# Patient Record
Sex: Female | Born: 1950 | Race: Black or African American | Smoking: Never smoker
Health system: Southern US, Community
[De-identification: ages and names within clinical notes are randomized; demographics above are authoritative.]

## PROBLEM LIST (undated history)

## (undated) DIAGNOSIS — E7800 Pure hypercholesterolemia, unspecified: Secondary | ICD-10-CM

## (undated) DIAGNOSIS — I10 Essential (primary) hypertension: Secondary | ICD-10-CM

## (undated) DIAGNOSIS — D869 Sarcoidosis, unspecified: Secondary | ICD-10-CM

## (undated) DIAGNOSIS — R7303 Prediabetes: Secondary | ICD-10-CM

## (undated) DIAGNOSIS — M1990 Unspecified osteoarthritis, unspecified site: Secondary | ICD-10-CM

## (undated) HISTORY — PX: TUBAL LIGATION: SHX77

## (undated) HISTORY — PX: COLONOSCOPY: SHX174

## (undated) HISTORY — PX: BRONCHOSCOPY: SUR163

---

## 1989-08-27 HISTORY — PX: LUMBAR DISC SURGERY: SHX700

## 1991-08-28 HISTORY — PX: ACETABULUM FRACTURE SURGERY: SHX541

## 2021-04-19 ENCOUNTER — Other Ambulatory Visit: Payer: Self-pay | Admitting: Internal Medicine

## 2021-04-19 DIAGNOSIS — Z1231 Encounter for screening mammogram for malignant neoplasm of breast: Secondary | ICD-10-CM

## 2021-08-27 DIAGNOSIS — J189 Pneumonia, unspecified organism: Secondary | ICD-10-CM

## 2021-08-27 HISTORY — DX: Pneumonia, unspecified organism: J18.9

## 2021-10-23 ENCOUNTER — Other Ambulatory Visit: Payer: Self-pay | Admitting: Internal Medicine

## 2021-10-23 DIAGNOSIS — Z1231 Encounter for screening mammogram for malignant neoplasm of breast: Secondary | ICD-10-CM

## 2021-10-30 ENCOUNTER — Other Ambulatory Visit: Payer: Self-pay | Admitting: Orthopedic Surgery

## 2021-10-30 DIAGNOSIS — M1651 Unilateral post-traumatic osteoarthritis, right hip: Secondary | ICD-10-CM

## 2021-11-14 ENCOUNTER — Other Ambulatory Visit: Payer: Self-pay

## 2021-11-14 ENCOUNTER — Ambulatory Visit
Admission: RE | Admit: 2021-11-14 | Discharge: 2021-11-14 | Disposition: A | Discharge status: Home / Self Care | Payer: Medicare HMO | Source: Ambulatory Visit | Attending: Orthopedic Surgery | Admitting: Orthopedic Surgery

## 2021-11-14 DIAGNOSIS — M1651 Unilateral post-traumatic osteoarthritis, right hip: Secondary | ICD-10-CM | POA: Diagnosis present

## 2021-11-21 ENCOUNTER — Other Ambulatory Visit: Payer: Self-pay | Admitting: Orthopedic Surgery

## 2021-11-27 ENCOUNTER — Other Ambulatory Visit: Payer: Self-pay

## 2021-11-27 ENCOUNTER — Encounter
Admission: RE | Admit: 2021-11-27 | Discharge: 2021-11-27 | Disposition: A | Discharge status: Home / Self Care | Payer: Medicare HMO | Source: Ambulatory Visit | Attending: Orthopedic Surgery | Admitting: Orthopedic Surgery

## 2021-11-27 VITALS — BP 143/64 | HR 61 | Resp 14 | Ht 66.0 in | Wt 241.0 lb

## 2021-11-27 DIAGNOSIS — Z01818 Encounter for other preprocedural examination: Secondary | ICD-10-CM | POA: Diagnosis present

## 2021-11-27 HISTORY — DX: Unspecified osteoarthritis, unspecified site: M19.90

## 2021-11-27 HISTORY — DX: Prediabetes: R73.03

## 2021-11-27 HISTORY — DX: Essential (primary) hypertension: I10

## 2021-11-27 HISTORY — DX: Pure hypercholesterolemia, unspecified: E78.00

## 2021-11-27 HISTORY — DX: Sarcoidosis, unspecified: D86.9

## 2021-11-27 LAB — CBC WITH DIFFERENTIAL/PLATELET
Abs Immature Granulocytes: 0 10*3/uL (ref 0.00–0.07)
Basophils Absolute: 0 10*3/uL (ref 0.0–0.1)
Basophils Relative: 1 %
Eosinophils Absolute: 0.1 10*3/uL (ref 0.0–0.5)
Eosinophils Relative: 2 %
HCT: 40.3 % (ref 36.0–46.0)
Hemoglobin: 12.7 g/dL (ref 12.0–15.0)
Immature Granulocytes: 0 %
Lymphocytes Relative: 38 %
Lymphs Abs: 1.5 10*3/uL (ref 0.7–4.0)
MCH: 29.3 pg (ref 26.0–34.0)
MCHC: 31.5 g/dL (ref 30.0–36.0)
MCV: 92.9 fL (ref 80.0–100.0)
Monocytes Absolute: 0.3 10*3/uL (ref 0.1–1.0)
Monocytes Relative: 7 %
Neutro Abs: 2.1 10*3/uL (ref 1.7–7.7)
Neutrophils Relative %: 52 %
Platelets: 302 10*3/uL (ref 150–400)
RBC: 4.34 MIL/uL (ref 3.87–5.11)
RDW: 14.4 % (ref 11.5–15.5)
WBC: 4 10*3/uL (ref 4.0–10.5)
nRBC: 0 % (ref 0.0–0.2)

## 2021-11-27 LAB — TYPE AND SCREEN
ABO/RH(D): O POS
Antibody Screen: NEGATIVE

## 2021-11-27 LAB — URINALYSIS, ROUTINE W REFLEX MICROSCOPIC
Bilirubin Urine: NEGATIVE
Glucose, UA: NEGATIVE mg/dL
Hgb urine dipstick: NEGATIVE
Ketones, ur: NEGATIVE mg/dL
Nitrite: NEGATIVE
Protein, ur: NEGATIVE mg/dL
Specific Gravity, Urine: 1.008 (ref 1.005–1.030)
pH: 6 (ref 5.0–8.0)

## 2021-11-27 LAB — SURGICAL PCR SCREEN
MRSA, PCR: NEGATIVE
Staphylococcus aureus: NEGATIVE

## 2021-11-27 NOTE — Patient Instructions (Signed)
Your procedure is scheduled on: 12/07/21 Report to [facility]. To find out your arrival time please call [phone] between 1PM - 3PM on 12/06/21.  Remember: Instructions that are not followed completely may result in serious medical risk, up to and including death, or upon the discretion of your surgeon and anesthesiologist your surgery may need to be rescheduled.     _X__ 1. Do not eat food after midnight the night before your procedure.                 No gum chewing or hard candies. You may drink clear liquids up to 2 hours                 before you are scheduled to arrive for your surgery- DO not drink clear                 liquids within 2 hours of the start of your surgery.                 Clear Liquids include:  water, apple juice without pulp, clear carbohydrate                 drink such as Clearfast or Gatorade, Black Coffee or Tea (Do not add                 anything to coffee or tea). Diabetics water only  DRINK THE ENSURE "CLEAR" PRE SURGERY DRINK 2 HOURS BEFORE ARRIVING  __X__2.  On the morning of surgery brush your teeth with toothpaste and water, you                 may rinse your mouth with mouthwash if you wish.  Do not swallow any              toothpaste of mouthwash.     _X__ 3.  No Alcohol for 24 hours before or after surgery.   _X__ 4.  Do Not Smoke or use e-cigarettes For 24 Hours Prior to Your Surgery.                 Do not use any chewable tobacco products for at least 6 hours prior to                 surgery.  ____  5.  Bring all medications with you on the day of surgery if instructed.   __X__  6.  Notify your doctor if there is any change in your medical condition      (cold, fever, infections).     Do not wear jewelry, make-up, hairpins, clips or nail polish. Do not wear lotions, powders, or perfumes. May wear deodorant if needed Do not shave body hair 48 hours prior to surgery. Men may shave face  and neck. Do not bring valuables to the hospital.    [facility] is not responsible for any belongings or valuables.  Contacts, dentures/partials or body piercings may not be worn into surgery. Bring a case for your contacts, glasses or hearing aids, a denture cup will be supplied. Leave your suitcase in the car. After surgery it may be brought to your room. For patients admitted to the hospital, discharge time is determined by your treatment team.   Patients discharged the day of surgery will not be allowed to drive home.   Please read over the following fact sheets that you were given:  MRSA Information, CHG soap, Incentive Spirometer, Ensure  __X__ Take these medicines the morning of surgery with A SIP OF WATER:    1. atorvastatin (LIPITOR) 40 MG tablet  2.   3.   4.  5.  6.  ____ Fleet Enema (as directed)   __X__ Use CHG Soap/SAGE wipes as directed  __X__ Use inhalers on the day of surgery  ____ Stop metformin/Janumet/Farxiga 2 days prior to surgery    ____ Take 1/2 of usual insulin dose the night before surgery. No insulin the morning          of surgery.   ____ Stop Blood Thinners Coumadin/Plavix/Xarelto/Pleta/Pradaxa/Eliquis/Effient/Aspirin  on   Or contact your Surgeon, Cardiologist or Medical Doctor regarding  ability to stop your blood thinners  __X__ Stop Anti-inflammatories 7 days before surgery such as Advil, Ibuprofen, Motrin,  BC or Goodies Powder, Naprosyn, Naproxen, Aleve, Aspirin   STOP MELOXIM 7 DAYS PRIOR TO SURGERY. YOU MAY TAKE TYLENOL 650 MG EVERY 6 HOURS AND STILL TAKE YOUR TYLENOL PM AT BEDTIME  __X__ Stop all herbals and supplements, fish oil or vitamins FOR 7 DAYS  until after surgery.    ____ Bring C-Pap to the hospital.

## 2021-12-01 ENCOUNTER — Ambulatory Visit
Admission: RE | Admit: 2021-12-01 | Discharge: 2021-12-01 | Disposition: A | Discharge status: Home / Self Care | Payer: Medicare HMO | Source: Ambulatory Visit | Attending: Internal Medicine | Admitting: Internal Medicine

## 2021-12-01 DIAGNOSIS — Z1231 Encounter for screening mammogram for malignant neoplasm of breast: Secondary | ICD-10-CM | POA: Diagnosis present

## 2021-12-04 ENCOUNTER — Inpatient Hospital Stay
Admission: RE | Admit: 2021-12-04 | Discharge: 2021-12-04 | Disposition: A | Discharge status: Home / Self Care | Payer: Self-pay | Source: Ambulatory Visit | Attending: *Deleted | Admitting: *Deleted

## 2021-12-04 ENCOUNTER — Other Ambulatory Visit: Payer: Self-pay | Admitting: *Deleted

## 2021-12-04 DIAGNOSIS — Z1231 Encounter for screening mammogram for malignant neoplasm of breast: Secondary | ICD-10-CM

## 2021-12-06 MED ORDER — CHLORHEXIDINE GLUCONATE 0.12 % MT SOLN: 15.0000 mL | Freq: Once | OROMUCOSAL | Status: AC

## 2021-12-06 MED ORDER — FAMOTIDINE 20 MG PO TABS: 20.0000 mg | ORAL_TABLET | Freq: Once | ORAL | Status: AC

## 2021-12-06 MED ORDER — LACTATED RINGERS IV SOLN: INTRAVENOUS | Status: DC

## 2021-12-06 MED ORDER — CEFAZOLIN SODIUM-DEXTROSE 2-4 GM/100ML-% IV SOLN
2.0000 g | INTRAVENOUS | Status: AC
  Administered 2021-12-07: 2 g via INTRAVENOUS

## 2021-12-06 MED ORDER — ORAL CARE MOUTH RINSE: 15.0000 mL | Freq: Once | OROMUCOSAL | Status: AC

## 2021-12-07 ENCOUNTER — Ambulatory Visit: Payer: Medicare HMO | Admitting: Urgent Care

## 2021-12-07 ENCOUNTER — Other Ambulatory Visit: Payer: Self-pay

## 2021-12-07 ENCOUNTER — Observation Stay
Admission: RE | Admit: 2021-12-07 | Discharge: 2021-12-08 | Disposition: A | Discharge status: Home Health | Payer: Medicare HMO | Attending: Orthopedic Surgery | Admitting: Orthopedic Surgery

## 2021-12-07 ENCOUNTER — Ambulatory Visit: Payer: Medicare HMO

## 2021-12-07 ENCOUNTER — Encounter
Admission: RE | Disposition: A | Discharge status: Home Health | Payer: Self-pay | Source: Home / Self Care | Attending: Orthopedic Surgery

## 2021-12-07 ENCOUNTER — Encounter: Payer: Self-pay | Admitting: Orthopedic Surgery

## 2021-12-07 ENCOUNTER — Observation Stay: Payer: Medicare HMO

## 2021-12-07 DIAGNOSIS — Z79899 Other long term (current) drug therapy: Secondary | ICD-10-CM | POA: Insufficient documentation

## 2021-12-07 DIAGNOSIS — I10 Essential (primary) hypertension: Secondary | ICD-10-CM | POA: Diagnosis not present

## 2021-12-07 DIAGNOSIS — R7303 Prediabetes: Secondary | ICD-10-CM | POA: Insufficient documentation

## 2021-12-07 DIAGNOSIS — M1651 Unilateral post-traumatic osteoarthritis, right hip: Principal | ICD-10-CM | POA: Insufficient documentation

## 2021-12-07 DIAGNOSIS — Z96641 Presence of right artificial hip joint: Secondary | ICD-10-CM

## 2021-12-07 HISTORY — PX: TOTAL HIP ARTHROPLASTY: SHX124

## 2021-12-07 LAB — CBC
HCT: 31.4 % — ABNORMAL LOW (ref 36.0–46.0)
Hemoglobin: 9.7 g/dL — ABNORMAL LOW (ref 12.0–15.0)
MCH: 29.4 pg (ref 26.0–34.0)
MCHC: 30.9 g/dL (ref 30.0–36.0)
MCV: 95.2 fL (ref 80.0–100.0)
Platelets: 213 10*3/uL (ref 150–400)
RBC: 3.3 MIL/uL — ABNORMAL LOW (ref 3.87–5.11)
RDW: 14.3 % (ref 11.5–15.5)
WBC: 8.4 10*3/uL (ref 4.0–10.5)
nRBC: 0 % (ref 0.0–0.2)

## 2021-12-07 LAB — ABO/RH: ABO/RH(D): O POS

## 2021-12-07 LAB — CREATININE, SERUM
Creatinine, Ser: 1.04 mg/dL — ABNORMAL HIGH (ref 0.44–1.00)
GFR, Estimated: 58 mL/min — ABNORMAL LOW (ref >60)

## 2021-12-07 SURGERY — ARTHROPLASTY, HIP, TOTAL, ANTERIOR APPROACH
Anesthesia: General | Site: Hip | Laterality: Right

## 2021-12-07 MED ORDER — HEPARIN 30,000 UNITS/1000 ML (OHS) CELLSAVER SOLUTION
Status: AC | PRN
  Administered 2021-12-07: 1

## 2021-12-07 MED ORDER — FENTANYL CITRATE (PF) 100 MCG/2ML IJ SOLN: 25.0000 ug | INTRAMUSCULAR | Status: DC | PRN

## 2021-12-07 MED ORDER — PANTOPRAZOLE SODIUM 40 MG PO TBEC
DELAYED_RELEASE_TABLET | ORAL | Status: AC
  Administered 2021-12-07: 40 mg via ORAL
  Filled 2021-12-07: qty 1

## 2021-12-07 MED ORDER — HYDROCODONE-ACETAMINOPHEN 7.5-325 MG PO TABS
ORAL_TABLET | ORAL | Status: AC
  Filled 2021-12-07: qty 2

## 2021-12-07 MED ORDER — SURGIPHOR WOUND IRRIGATION SYSTEM - OPTIME: TOPICAL | Status: DC | PRN

## 2021-12-07 MED ORDER — KETOROLAC TROMETHAMINE 30 MG/ML IJ SOLN
INTRAMUSCULAR | Status: DC | PRN
  Administered 2021-12-07: 15 mg via INTRAVENOUS

## 2021-12-07 MED ORDER — METHOCARBAMOL 1000 MG/10ML IJ SOLN
500.0000 mg | Freq: Four times a day (QID) | INTRAVENOUS | Status: DC | PRN
  Filled 2021-12-07: qty 5

## 2021-12-07 MED ORDER — MONTELUKAST SODIUM 10 MG PO TABS
10.0000 mg | ORAL_TABLET | Freq: Every evening | ORAL | Status: DC | PRN
  Filled 2021-12-07: qty 1

## 2021-12-07 MED ORDER — PHENOL 1.4 % MT LIQD: 1.0000 | OROMUCOSAL | Status: DC | PRN

## 2021-12-07 MED ORDER — ONDANSETRON HCL 4 MG/2ML IJ SOLN: 4.0000 mg | Freq: Once | INTRAMUSCULAR | Status: DC | PRN

## 2021-12-07 MED ORDER — FAMOTIDINE 20 MG PO TABS
ORAL_TABLET | ORAL | Status: AC
  Administered 2021-12-07: 20 mg via ORAL
  Filled 2021-12-07: qty 1

## 2021-12-07 MED ORDER — PROPOFOL 500 MG/50ML IV EMUL
INTRAVENOUS | Status: DC | PRN
  Administered 2021-12-07: 100 ug/kg/min via INTRAVENOUS

## 2021-12-07 MED ORDER — CEFAZOLIN SODIUM-DEXTROSE 2-4 GM/100ML-% IV SOLN
INTRAVENOUS | Status: AC
Start: 2021-12-07 — End: 2021-12-07
  Filled 2021-12-07: qty 100

## 2021-12-07 MED ORDER — LOSARTAN POTASSIUM-HCTZ 50-12.5 MG PO TABS: 1.0000 | ORAL_TABLET | Freq: Every day | ORAL | Status: DC

## 2021-12-07 MED ORDER — SODIUM CHLORIDE 0.9 % IV SOLN: INTRAVENOUS | Status: DC

## 2021-12-07 MED ORDER — MORPHINE SULFATE (PF) 4 MG/ML IV SOLN
INTRAVENOUS | Status: AC
  Filled 2021-12-07: qty 1

## 2021-12-07 MED ORDER — CEFAZOLIN SODIUM-DEXTROSE 2-4 GM/100ML-% IV SOLN
INTRAVENOUS | Status: AC
  Administered 2021-12-07: 2 g via INTRAVENOUS
  Filled 2021-12-07: qty 100

## 2021-12-07 MED ORDER — FLUTICASONE FUROATE-VILANTEROL 100-25 MCG/ACT IN AEPB
1.0000 | INHALATION_SPRAY | Freq: Every day | RESPIRATORY_TRACT | Status: DC
  Administered 2021-12-08: 1 via RESPIRATORY_TRACT
  Filled 2021-12-07: qty 28

## 2021-12-07 MED ORDER — BUPIVACAINE-EPINEPHRINE (PF) 0.25% -1:200000 IJ SOLN
INTRAMUSCULAR | Status: AC
  Filled 2021-12-07: qty 30

## 2021-12-07 MED ORDER — TRAMADOL HCL 50 MG PO TABS
50.0000 mg | ORAL_TABLET | Freq: Four times a day (QID) | ORAL | Status: DC
  Administered 2021-12-07 – 2021-12-08 (×2): 50 mg via ORAL

## 2021-12-07 MED ORDER — ZOLPIDEM TARTRATE 5 MG PO TABS: 5.0000 mg | ORAL_TABLET | Freq: Every evening | ORAL | Status: DC | PRN

## 2021-12-07 MED ORDER — HYDROCODONE-ACETAMINOPHEN 5-325 MG PO TABS
1.0000 | ORAL_TABLET | ORAL | Status: DC | PRN
  Administered 2021-12-07: 2 via ORAL

## 2021-12-07 MED ORDER — DOCUSATE SODIUM 100 MG PO CAPS
ORAL_CAPSULE | ORAL | Status: AC
  Administered 2021-12-07: 100 mg via ORAL
  Filled 2021-12-07: qty 1

## 2021-12-07 MED ORDER — BUPIVACAINE HCL (PF) 0.5 % IJ SOLN
INTRAMUSCULAR | Status: DC | PRN
  Administered 2021-12-07: 2.3 mL

## 2021-12-07 MED ORDER — PHENYLEPHRINE HCL-NACL 20-0.9 MG/250ML-% IV SOLN
INTRAVENOUS | Status: DC | PRN
  Administered 2021-12-07: 40 ug/min via INTRAVENOUS

## 2021-12-07 MED ORDER — 0.9 % SODIUM CHLORIDE (POUR BTL) OPTIME
TOPICAL | Status: DC | PRN
  Administered 2021-12-07: 1000 mL

## 2021-12-07 MED ORDER — LOSARTAN POTASSIUM 50 MG PO TABS
50.0000 mg | ORAL_TABLET | Freq: Every day | ORAL | Status: DC
  Filled 2021-12-07 (×2): qty 1

## 2021-12-07 MED ORDER — ACETAMINOPHEN 325 MG PO TABS: 325.0000 mg | ORAL_TABLET | Freq: Four times a day (QID) | ORAL | Status: DC | PRN

## 2021-12-07 MED ORDER — METOCLOPRAMIDE HCL 10 MG PO TABS: 5.0000 mg | ORAL_TABLET | Freq: Three times a day (TID) | ORAL | Status: DC | PRN

## 2021-12-07 MED ORDER — ONDANSETRON HCL 4 MG/2ML IJ SOLN
INTRAMUSCULAR | Status: AC
  Filled 2021-12-07: qty 2

## 2021-12-07 MED ORDER — ACETAMINOPHEN 10 MG/ML IV SOLN
INTRAVENOUS | Status: AC
  Filled 2021-12-07: qty 100

## 2021-12-07 MED ORDER — ALUM & MAG HYDROXIDE-SIMETH 200-200-20 MG/5ML PO SUSP: 30.0000 mL | ORAL | Status: DC | PRN

## 2021-12-07 MED ORDER — KETOROLAC TROMETHAMINE 30 MG/ML IJ SOLN
INTRAMUSCULAR | Status: AC
  Filled 2021-12-07: qty 1

## 2021-12-07 MED ORDER — CEFAZOLIN SODIUM-DEXTROSE 2-4 GM/100ML-% IV SOLN: 2.0000 g | Freq: Four times a day (QID) | INTRAVENOUS | Status: AC

## 2021-12-07 MED ORDER — ONDANSETRON HCL 4 MG PO TABS: 4.0000 mg | ORAL_TABLET | Freq: Four times a day (QID) | ORAL | Status: DC | PRN

## 2021-12-07 MED ORDER — PANTOPRAZOLE SODIUM 40 MG PO TBEC: 40.0000 mg | DELAYED_RELEASE_TABLET | Freq: Every day | ORAL | Status: DC

## 2021-12-07 MED ORDER — HEPARIN 30,000 UNITS/1000 ML (OHS) CELLSAVER SOLUTION
Status: AC
  Filled 2021-12-07: qty 1000

## 2021-12-07 MED ORDER — MIDAZOLAM HCL 2 MG/2ML IJ SOLN
INTRAMUSCULAR | Status: AC
  Filled 2021-12-07: qty 2

## 2021-12-07 MED ORDER — ONDANSETRON HCL 4 MG/2ML IJ SOLN
INTRAMUSCULAR | Status: DC | PRN
  Administered 2021-12-07: 4 mg via INTRAVENOUS

## 2021-12-07 MED ORDER — MORPHINE SULFATE (PF) 4 MG/ML IV SOLN: 0.5200 mg | INTRAVENOUS | Status: DC | PRN

## 2021-12-07 MED ORDER — TRAMADOL HCL 50 MG PO TABS
ORAL_TABLET | ORAL | Status: AC
  Filled 2021-12-07: qty 1

## 2021-12-07 MED ORDER — EPHEDRINE 5 MG/ML INJ
INTRAVENOUS | Status: AC
  Filled 2021-12-07: qty 5

## 2021-12-07 MED ORDER — DIPHENHYDRAMINE HCL 12.5 MG/5ML PO ELIX: 12.5000 mg | ORAL_SOLUTION | ORAL | Status: DC | PRN

## 2021-12-07 MED ORDER — METHOCARBAMOL 500 MG PO TABS
ORAL_TABLET | ORAL | Status: AC
  Administered 2021-12-07: 500 mg via ORAL
  Filled 2021-12-07: qty 1

## 2021-12-07 MED ORDER — SENNOSIDES-DOCUSATE SODIUM 8.6-50 MG PO TABS: 1.0000 | ORAL_TABLET | Freq: Every evening | ORAL | Status: DC | PRN

## 2021-12-07 MED ORDER — BUPIVACAINE LIPOSOME 1.3 % IJ SUSP
INTRAMUSCULAR | Status: AC
  Filled 2021-12-07: qty 20

## 2021-12-07 MED ORDER — ENOXAPARIN SODIUM 40 MG/0.4ML IJ SOSY: 40.0000 mg | PREFILLED_SYRINGE | INTRAMUSCULAR | Status: DC

## 2021-12-07 MED ORDER — BUPIVACAINE HCL (PF) 0.5 % IJ SOLN
INTRAMUSCULAR | Status: AC
  Filled 2021-12-07: qty 10

## 2021-12-07 MED ORDER — LACTATED RINGERS IV SOLN: INTRAVENOUS | Status: DC

## 2021-12-07 MED ORDER — TRAMADOL HCL 50 MG PO TABS
ORAL_TABLET | ORAL | Status: AC
  Administered 2021-12-07: 50 mg via ORAL
  Filled 2021-12-07: qty 1

## 2021-12-07 MED ORDER — FENTANYL CITRATE (PF) 100 MCG/2ML IJ SOLN
INTRAMUSCULAR | Status: DC | PRN
  Administered 2021-12-07: 50 ug via INTRAVENOUS

## 2021-12-07 MED ORDER — PHENYLEPHRINE HCL (PRESSORS) 10 MG/ML IV SOLN
INTRAVENOUS | Status: DC | PRN
  Administered 2021-12-07 (×3): 160 ug via INTRAVENOUS

## 2021-12-07 MED ORDER — METHOCARBAMOL 500 MG PO TABS: 500.0000 mg | ORAL_TABLET | Freq: Four times a day (QID) | ORAL | Status: DC | PRN

## 2021-12-07 MED ORDER — FLEET ENEMA 7-19 GM/118ML RE ENEM: 1.0000 | ENEMA | Freq: Once | RECTAL | Status: DC | PRN

## 2021-12-07 MED ORDER — MIDAZOLAM HCL 5 MG/5ML IJ SOLN
INTRAMUSCULAR | Status: DC | PRN
  Administered 2021-12-07 (×2): 1 mg via INTRAVENOUS

## 2021-12-07 MED ORDER — OXYCODONE HCL 5 MG/5ML PO SOLN: 5.0000 mg | Freq: Once | ORAL | Status: DC | PRN

## 2021-12-07 MED ORDER — HEMOSTATIC AGENTS (NO CHARGE) OPTIME
TOPICAL | Status: DC | PRN
  Administered 2021-12-07: 2 via TOPICAL

## 2021-12-07 MED ORDER — BISACODYL 10 MG RE SUPP
10.0000 mg | Freq: Every day | RECTAL | Status: DC | PRN
  Filled 2021-12-07: qty 1

## 2021-12-07 MED ORDER — HYDROCODONE-ACETAMINOPHEN 7.5-325 MG PO TABS
1.0000 | ORAL_TABLET | ORAL | Status: DC | PRN
  Administered 2021-12-07: 2 via ORAL
  Administered 2021-12-08: 1 via ORAL

## 2021-12-07 MED ORDER — PHENYLEPHRINE HCL-NACL 20-0.9 MG/250ML-% IV SOLN
INTRAVENOUS | Status: AC
  Filled 2021-12-07: qty 250

## 2021-12-07 MED ORDER — HYDROCODONE-ACETAMINOPHEN 5-325 MG PO TABS
ORAL_TABLET | ORAL | Status: AC
  Filled 2021-12-07: qty 2

## 2021-12-07 MED ORDER — ALBUTEROL SULFATE (2.5 MG/3ML) 0.083% IN NEBU: 3.0000 mL | INHALATION_SOLUTION | Freq: Four times a day (QID) | RESPIRATORY_TRACT | Status: DC | PRN

## 2021-12-07 MED ORDER — ACETAMINOPHEN 10 MG/ML IV SOLN: 1000.0000 mg | Freq: Once | INTRAVENOUS | Status: DC | PRN

## 2021-12-07 MED ORDER — FENTANYL CITRATE (PF) 100 MCG/2ML IJ SOLN
INTRAMUSCULAR | Status: AC
  Filled 2021-12-07: qty 2

## 2021-12-07 MED ORDER — MENTHOL 3 MG MT LOZG: 1.0000 | LOZENGE | OROMUCOSAL | Status: DC | PRN

## 2021-12-07 MED ORDER — GLYCOPYRROLATE 0.2 MG/ML IJ SOLN
INTRAMUSCULAR | Status: DC | PRN
  Administered 2021-12-07 (×2): .1 mg via INTRAVENOUS

## 2021-12-07 MED ORDER — MORPHINE SULFATE (PF) 4 MG/ML IV SOLN: 0.5000 mg | INTRAVENOUS | Status: DC | PRN

## 2021-12-07 MED ORDER — EPHEDRINE SULFATE (PRESSORS) 50 MG/ML IJ SOLN
INTRAMUSCULAR | Status: DC | PRN
  Administered 2021-12-07: 10 mg via INTRAVENOUS
  Administered 2021-12-07: 5 mg via INTRAVENOUS
  Administered 2021-12-07: 10 mg via INTRAVENOUS

## 2021-12-07 MED ORDER — CHLORHEXIDINE GLUCONATE 0.12 % MT SOLN
OROMUCOSAL | Status: AC
  Administered 2021-12-07: 15 mL via OROMUCOSAL
  Filled 2021-12-07: qty 15

## 2021-12-07 MED ORDER — VITAMIN D3 25 MCG (1000 UNIT) PO TABS
1000.0000 [IU] | ORAL_TABLET | Freq: Every day | ORAL | Status: DC
  Administered 2021-12-07 – 2021-12-08 (×2): 1000 [IU] via ORAL
  Filled 2021-12-07 (×2): qty 1

## 2021-12-07 MED ORDER — MORPHINE SULFATE (PF) 4 MG/ML IV SOLN
0.5200 mg | INTRAVENOUS | Status: DC | PRN
  Administered 2021-12-07: 1 mg via INTRAVENOUS

## 2021-12-07 MED ORDER — METOCLOPRAMIDE HCL 5 MG/ML IJ SOLN: 5.0000 mg | Freq: Three times a day (TID) | INTRAMUSCULAR | Status: DC | PRN

## 2021-12-07 MED ORDER — DOCUSATE SODIUM 100 MG PO CAPS
100.0000 mg | ORAL_CAPSULE | Freq: Two times a day (BID) | ORAL | Status: DC
  Administered 2021-12-08: 100 mg via ORAL

## 2021-12-07 MED ORDER — TRANEXAMIC ACID 1000 MG/10ML IV SOLN
INTRAVENOUS | Status: AC
  Filled 2021-12-07: qty 10

## 2021-12-07 MED ORDER — HYDROCHLOROTHIAZIDE 12.5 MG PO TABS
12.5000 mg | ORAL_TABLET | Freq: Every day | ORAL | Status: DC
  Filled 2021-12-07 (×2): qty 1

## 2021-12-07 MED ORDER — OXYCODONE HCL 5 MG PO TABS: 5.0000 mg | ORAL_TABLET | Freq: Once | ORAL | Status: DC | PRN

## 2021-12-07 MED ORDER — ONDANSETRON HCL 4 MG/2ML IJ SOLN
4.0000 mg | Freq: Four times a day (QID) | INTRAMUSCULAR | Status: DC | PRN
  Administered 2021-12-08: 4 mg via INTRAVENOUS

## 2021-12-07 MED ORDER — TRANEXAMIC ACID-NACL 1000-0.7 MG/100ML-% IV SOLN
INTRAVENOUS | Status: DC | PRN
  Administered 2021-12-07: 1000 mg via INTRAVENOUS

## 2021-12-07 MED ORDER — SODIUM CHLORIDE 0.9 % IV SOLN: INTRAVENOUS | Status: DC | PRN

## 2021-12-07 MED ORDER — SODIUM CHLORIDE 0.9 % IR SOLN
Status: DC | PRN
Start: 2021-12-07 — End: 2021-12-07
  Administered 2021-12-07: 250 mL

## 2021-12-07 MED ORDER — ATORVASTATIN CALCIUM 20 MG PO TABS
40.0000 mg | ORAL_TABLET | Freq: Every day | ORAL | Status: DC
  Administered 2021-12-08: 40 mg via ORAL
  Filled 2021-12-07 (×2): qty 2

## 2021-12-07 MED ORDER — PROPOFOL 1000 MG/100ML IV EMUL
INTRAVENOUS | Status: AC
  Filled 2021-12-07: qty 100

## 2021-12-07 MED ORDER — ACETAMINOPHEN 10 MG/ML IV SOLN
INTRAVENOUS | Status: DC | PRN
Start: 2021-12-07 — End: 2021-12-07
  Administered 2021-12-07: 1000 mg via INTRAVENOUS

## 2021-12-07 MED ORDER — BUPIVACAINE-EPINEPHRINE 0.25% -1:200000 IJ SOLN
INTRAMUSCULAR | Status: DC | PRN
  Administered 2021-12-07: 90 mL via INTRAMUSCULAR

## 2021-12-07 MED ORDER — SODIUM CHLORIDE FLUSH 0.9 % IV SOLN
INTRAVENOUS | Status: AC
  Filled 2021-12-07: qty 40

## 2021-12-07 SURGICAL SUPPLY — 58 items
BLADE SAGITTAL AGGR TOOTH XLG (BLADE) ×2 IMPLANT
BNDG COHESIVE 6X5 TAN ST LF (GAUZE/BANDAGES/DRESSINGS) ×5 IMPLANT
CANISTER WOUND CARE 500ML ATS (WOUND CARE) ×2 IMPLANT
CHLORAPREP W/TINT 26 (MISCELLANEOUS) ×2 IMPLANT
COVER BACK TABLE REUSABLE LG (DRAPES) ×2 IMPLANT
DRAPE 3/4 80X56 (DRAPES) ×6 IMPLANT
DRAPE C-ARM XRAY 36X54 (DRAPES) ×2 IMPLANT
DRAPE INCISE IOBAN 66X60 STRL (DRAPES) ×1 IMPLANT
DRAPE POUCH INSTRU U-SHP 10X18 (DRAPES) ×2 IMPLANT
DRESSING SURGICEL FIBRLLR 1X2 (HEMOSTASIS) ×2 IMPLANT
DRSG MEPILEX SACRM 8.7X9.8 (GAUZE/BANDAGES/DRESSINGS) ×2 IMPLANT
DRSG SURGICEL FIBRILLAR 1X2 (HEMOSTASIS) ×4
ELECT BLADE 6.5 EXT (BLADE) ×2 IMPLANT
ELECT REM PT RETURN 9FT ADLT (ELECTROSURGICAL) ×2
ELECTRODE REM PT RTRN 9FT ADLT (ELECTROSURGICAL) ×1 IMPLANT
GLOVE SURG SYN 9.0  PF PI (GLOVE) ×2
GLOVE SURG SYN 9.0 PF PI (GLOVE) ×2 IMPLANT
GLOVE SURG UNDER POLY LF SZ9 (GLOVE) ×2 IMPLANT
GOWN SRG 2XL LVL 4 RGLN SLV (GOWNS) ×1 IMPLANT
GOWN STRL NON-REIN 2XL LVL4 (GOWNS) ×1
GOWN STRL REUS W/ TWL LRG LVL3 (GOWN DISPOSABLE) ×1 IMPLANT
GOWN STRL REUS W/TWL LRG LVL3 (GOWN DISPOSABLE) ×1
HIP FEM HD M 28 (Head) ×1 IMPLANT
HOLDER FOLEY CATH W/STRAP (MISCELLANEOUS) ×1 IMPLANT
HOOD PEEL AWAY FLYTE STAYCOOL (MISCELLANEOUS) ×2 IMPLANT
KIT PREVENA INCISION MGT 13 (CANNISTER) ×2 IMPLANT
LINE SUCTION ATS CATS PLUS (LINER) ×1 IMPLANT
LINER DBL MOB SZ 0 52MM (Liner) ×1 IMPLANT
MANIFOLD NEPTUNE II (INSTRUMENTS) ×2 IMPLANT
MASTERLOC HIP LATERAL S7 (Hips) ×1 IMPLANT
MAT ABSORB  FLUID 56X50 GRAY (MISCELLANEOUS) ×1
MAT ABSORB FLUID 56X50 GRAY (MISCELLANEOUS) ×1 IMPLANT
NDL SPNL 20GX3.5 QUINCKE YW (NEEDLE) ×2 IMPLANT
NEEDLE SPNL 20GX3.5 QUINCKE YW (NEEDLE) ×4 IMPLANT
NS IRRIG 1000ML POUR BTL (IV SOLUTION) ×2 IMPLANT
PACK HIP COMPR (MISCELLANEOUS) ×2 IMPLANT
SCALPEL PROTECTED #10 DISP (BLADE) ×4 IMPLANT
SHELL ACETABULAR SZ 52 DM (Shell) ×1 IMPLANT
SOLUTION IRRIG SURGIPHOR (IV SOLUTION) ×1 IMPLANT
STAPLER SKIN PROX 35W (STAPLE) ×2 IMPLANT
STRAP SAFETY 5IN WIDE (MISCELLANEOUS) ×2 IMPLANT
SUT DVC 2 QUILL PDO  T11 36X36 (SUTURE) ×1
SUT DVC 2 QUILL PDO T11 36X36 (SUTURE) ×1 IMPLANT
SUT SILK 0 (SUTURE) ×1
SUT SILK 0 30XBRD TIE 6 (SUTURE) ×1 IMPLANT
SUT V-LOC 90 ABS 3-0 VLT  V-20 (SUTURE) ×1
SUT V-LOC 90 ABS 3-0 VLT V-20 (SUTURE) IMPLANT
SUT V-LOC 90 ABS DVC 3-0 CL (SUTURE) ×1 IMPLANT
SUT VIC AB 1 CT1 36 (SUTURE) ×2 IMPLANT
SYR 20ML LL LF (SYRINGE) ×2 IMPLANT
SYR 30ML LL (SYRINGE) ×1 IMPLANT
SYR 50ML LL SCALE MARK (SYRINGE) ×4 IMPLANT
SYR BULB IRRIG 60ML STRL (SYRINGE) ×2 IMPLANT
TAPE MICROFOAM 4IN (TAPE) ×2 IMPLANT
TOWEL OR 17X26 4PK STRL BLUE (TOWEL DISPOSABLE) IMPLANT
TRAY FOLEY MTR SLVR 16FR STAT (SET/KITS/TRAYS/PACK) ×2 IMPLANT
WAND WEREWOLF FASTSEAL 6.0 (MISCELLANEOUS) ×1 IMPLANT
WATER STERILE IRR 1000ML POUR (IV SOLUTION) ×1 IMPLANT

## 2021-12-07 NOTE — Evaluation (Signed)
Physical Therapy Evaluation ?Patient Details ?Name: Kara Hill ?MRN: [id number] ?DOB: [birth year] ?Today's Date: 12/07/2021 ? ?History of Present Illness ? Pt is s/p R THA with anterior approach on 12/07/21.  Pt has PMH including:  HTN, and arthritis.  ?Clinical Impression ? Pt received in Semi-Fowler's position and agreeable to therapy.  Pt performed bed-level exercises with good technique and able to recall precaution after educating pt on it.  Pt did require AAROM of the R LE for SLR at first, however with 3 eccentric lowering, pt was able to complete SLR independently.  Pt then transferred to seated position at EOB.  PT somehwat dizzy initially, but was able to recover with pursed lip breathing.  Pt stood at bedside, but noted to be very dizzy and faint losing color in face.  Pt assisted back into supine position and place in Trendelenburg position to assist with blood flow.  Pt able to continue to communicate throughout, however had low BP in position (106/39).  Pt's BP lower at baseline looking back at vitals however.  Pt returned to semi fowler's position with pt eating lunch at conclusion of session.  Anticipate discharge to home with HHPT at this time.  Pt will continue to benefit from skilled therapy in order to address deficits listed below. ? ?   ? ?Recommendations for follow up therapy are one component of a multi-disciplinary discharge planning process, led by the attending physician.  Recommendations may be updated based on patient status, additional functional criteria and insurance authorization. ? ?Follow Up Recommendations Home health PT ? ?  ?Assistance Recommended at Discharge PRN  ?Patient can return home with the following ? A little help with walking and/or transfers;A little help with bathing/dressing/bathroom ? ?  ?Equipment Recommendations Rolling walker (2 wheels);BSC/3in1;Other (comment) (Bariatric rolling walker/BSC.)  ?Recommendations for Other Services ?    ?  ?Functional Status  Assessment Patient has had a recent decline in their functional status and demonstrates the ability to make significant improvements in function in a reasonable and predictable amount of time.  ? ?  ?Precautions / Restrictions Precautions ?Precautions: Anterior Hip ?Restrictions ?Weight Bearing Restrictions: Yes ?RLE Weight Bearing: Weight bearing as tolerated  ? ?  ? ?Mobility ? Bed Mobility ?Overal bed mobility: Needs Assistance ?Bed Mobility: Supine to Sit, Sit to Supine ?  ?  ?Supine to sit: Min guard ?Sit to supine: Max assist ?  ?General bed mobility comments: CGA and verbal cues for coming into seated at EOB position.  MaxA transiion from the sitting to supine due to pt becoming faint with mobility. ?  ? ?Transfers ?Overall transfer level: Needs assistance ?Equipment used: Standard walker ?Transfers: Sit to/from Stand ?Sit to Stand: Min guard ?  ?  ?  ?  ?  ?General transfer comment: Pt with good technique after verbal cues for proper hand placement. ?  ? ?Ambulation/Gait ?  ?  ?  ?  ?Gait velocity: decreased ?  ?  ?General Gait Details: deferred due to pt becoming faint with mobility. ? ?Stairs ?  ?  ?  ?  ?  ? ?Wheelchair Mobility ?  ? ?Modified Rankin (Stroke Patients Only) ?  ? ?  ? ?Balance Overall balance assessment: Needs assistance ?Sitting-balance support: No upper extremity supported, Feet supported ?Sitting balance-Leahy Scale: Good ?  ?  ?Standing balance support: Bilateral upper extremity supported ?Standing balance-Leahy Scale: Fair ?  ?  ?  ?  ?  ?  ?  ?  ?  ?  ?  ?  ?   ? ? ? ?  Pertinent Vitals/Pain Pain Assessment ?Pain Assessment: 0-10 ?Pain Score: 9  ?Pain Descriptors / Indicators: Aching ?Pain Intervention(s): Limited activity within patient's tolerance, Monitored during session, RN gave pain meds during session, Repositioned  ? ? ?Home Living Family/patient expects to be discharged to:: Private residence ?Living Arrangements: Children (Daughter & Son) ?Available Help at Discharge: Family  (Daughter works 12 hours shifts, goes to school, and works at [employer] as NT.) ?Type of Home: House ?Home Access: Level entry (1 stoop) ?  ?  ?  ?Home Layout: One level ?Home Equipment: Toilet riser;Cane - single point;BSC/3in1;Shower seat - built in;Hand held shower head ?   ?  ?Prior Function Prior Level of Function : Independent/Modified Independent ?  ?  ?  ?  ?  ?  ?  ?  ?  ? ? ?Hand Dominance  ? Dominant Hand: Right ? ?  ?Extremity/Trunk Assessment  ? Upper Extremity Assessment ?Upper Extremity Assessment: Overall WFL for tasks assessed ?  ? ?Lower Extremity Assessment ?Lower Extremity Assessment: RLE deficits/detail ?RLE Deficits / Details: weakness s/p R THA with anterior approach. ?  ? ?   ?Communication  ? Communication: No difficulties  ?Cognition Arousal/Alertness: Awake/alert ?Behavior During Therapy: [facility] for tasks assessed/performed ?Overall Cognitive Status: Within Functional Limits for tasks assessed ?  ?  ?  ?  ?  ?  ?  ?  ?  ?  ?  ?  ?  ?  ?  ?  ?  ?  ?  ? ?  ?General Comments   ? ?  ?Exercises Total Joint Exercises ?Ankle Circles/Pumps: AROM, Strengthening, Both, 10 reps, Supine ?Quad Sets: AROM, Strengthening, Both, 10 reps, Supine ?Gluteal Sets: AROM, Strengthening, Both, 10 reps, Supine ?Towel Squeeze: AROM, Strengthening, Both, 10 reps, Supine ?Heel Slides: AROM, Strengthening, Both, 10 reps, Supine ?Hip ABduction/ADduction: AROM, Strengthening, Both, 10 reps, Supine ?Straight Leg Raises: AROM, Left, AAROM, Right, Strengthening, 10 reps, Supine ?Long Arc Quad: AROM, Strengthening, Both, 10 reps, Seated  ? ?Assessment/Plan  ?  ?PT Assessment Patient needs continued PT services  ?PT Problem List Decreased strength;Decreased range of motion;Decreased activity tolerance;Decreased balance;Decreased mobility;Decreased knowledge of use of DME;Decreased safety awareness ? ?   ?  ?PT Treatment Interventions DME instruction;Gait training;Functional mobility training;Therapeutic  activities;Therapeutic exercise;Balance training;Neuromuscular re-education   ? ?PT Goals (Current goals can be found in the Care Plan section)  ?Acute Rehab PT Goals ?Patient Stated Goal: to go home. ?PT Goal Formulation: With patient ?Time For Goal Achievement: 12/21/21 ?Potential to Achieve Goals: Good ? ?  ?Frequency BID ?  ? ? ?Co-evaluation   ?  ?  ?  ?  ? ? ?  ?AM-PAC PT "6 Clicks" Mobility  ?Outcome Measure Help needed turning from your back to your side while in a flat bed without using bedrails?: A Little ?Help needed moving from lying on your back to sitting on the side of a flat bed without using bedrails?: A Little ?Help needed moving to and from a bed to a chair (including a wheelchair)?: A Lot ?Help needed standing up from a chair using your arms (e.g., wheelchair or bedside chair)?: A Little ?Help needed to walk in hospital room?: A Lot ?Help needed climbing 3-5 steps with a railing? : A Lot ?6 Click Score: 15 ? ?  ?End of Session Equipment Utilized During Treatment: Gait belt ?Activity Tolerance: Patient tolerated treatment well ?Patient left: in bed;with call bell/phone within reach;with bed alarm set;with nursing/sitter in room ?Nurse Communication: Mobility status ?PT  Visit Diagnosis: Unsteadiness on feet (R26.81);Other abnormalities of gait and mobility (R26.89);Muscle weakness (generalized) (M62.81);Difficulty in walking, not elsewhere classified (R26.2);Pain ?Pain - Right/Left: Right ?Pain - part of body: Hip ?  ? ?Time: 5947-0761 ?PT Time Calculation (min) (ACUTE ONLY): 50 min ? ? ?Charges:   PT Evaluation ?$PT Eval Low Complexity: 1 Low ?PT Treatments ?$Therapeutic Exercise: 8-22 mins ?$Therapeutic Activity: 23-37 mins ?  ?   ? ? ?Gwenlyn Saran, PT, DPT ?12/07/21, 3:55 PM ? ? ?Christie Nottingham ?12/07/2021, 3:50 PM ? ?

## 2021-12-07 NOTE — Anesthesia Postprocedure Evaluation (Signed)
Anesthesia Post Note ? ?Patient: Kara Hill ? ?Procedure(s) Performed: TOTAL HIP ARTHROPLASTY ANTERIOR APPROACH WITH CELL SAVER (Right: Hip) ?APPLICATION OF CELL SAVER (Right: Hip) ? ?Patient location during evaluation: PACU ?Anesthesia Type: Spinal ?Level of consciousness: awake and alert, oriented and patient cooperative ?Pain management: pain level controlled ?Vital Signs Assessment: post-procedure vital signs reviewed and stable ?Respiratory status: spontaneous breathing, nonlabored ventilation and respiratory function stable ?Cardiovascular status: blood pressure returned to baseline and stable ?Postop Assessment: adequate PO intake ?Anesthetic complications: no ? ? ?No notable events documented. ? ? ?Last Vitals:  ?Vitals:  ? 12/07/21 1145 12/07/21 1205  ?BP: (!) 110/56 (!) 104/55  ?Pulse: 66 62  ?Resp: 16 18  ?Temp:  36.4 ?C  ?SpO2: 100% 100%  ?  ?Last Pain:  ?Vitals:  ? 12/07/21 1205  ?TempSrc: Temporal  ?PainSc: 8   ? ? ?  ?  ?  ?  ?  ?  ? ?Darrin Nipper ? ? ? ? ?

## 2021-12-07 NOTE — Op Note (Signed)
12/07/2021 ? ?9:45 AM ? ?PATIENT:  Kara Hill  71 y.o. female ? 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 ?PROCEDURE:  Procedure(s): ?TOTAL HIP ARTHROPLASTY ANTERIOR APPROACH WITH CELL SAVER (Right) ? ?SURGEON: Laurene Footman, MD ? ?ASSISTANTS: None ? ?ANESTHESIA:   spinal ? ?EBL:  Total I/O ?In: 1000 [I.V.:900; IV Piggyback:100] ?Out: 600 [Urine:400; Blood:200] ? ?BLOOD ADMINISTERED:none ? ?DRAINS:  Incisional wound VAC   ? ?LOCAL MEDICATIONS USED:  MARCAINE    and OTHER Exparel ? ?SPECIMEN:  Source of Specimen:    Femoral head right ? ?DISPOSITION OF SPECIMEN:  PATHOLOGY ? ?COUNTS:  YES ? ?TOURNIQUET:  * No tourniquets in log * ? ?IMPLANTS: Medacta Masterloc 7 LAT with 52 mm Mpact DM cup and liner, size M metal 28 mm head ? ?DICTATION: .Dragon Dictation   The patient was brought to the operating room and after spinal anesthesia was obtained patient was placed on the operative table with the ipsilateral foot into the Medacta attachment, contralateral leg on a well-padded table. C-arm was brought in and preop template x-ray taken. After prepping and draping in usual sterile fashion appropriate patient identification and timeout procedures were completed. Anterior approach to the hip was obtained and centered over the greater trochanter and TFL muscle.  Cell Saver utilized during the procedure but none of blood was lost to reinfused.  The subcutaneous tissue was incised hemostasis being achieved by electrocautery. TFL fascia was incised and the muscle retracted laterally deep retractor placed. The lateral femoral circumflex vessels were identified and ligated. The anterior capsule was exposed and a capsulotomy performed. The neck was identified and a femoral neck cut carried out with a saw. The head was removed without difficulty and showed sclerotic femoral head and acetabulum. Reaming was  carried out to 52 mm without hitting any of the prior screws.  A 52 mm cup trial gave appropriate tightness to the acetabular component a 52 DM cup was impacted into position. The leg was then externally rotated and ischiofemoral and pubofemoral releases carried out. The femur was sequentially broached to a size 7, size 7 LAT with S head trials were placed and the final components chosen. The 7 LAT stem was inserted along with a metal M 28 mm head and 52 mm liner. The hip was reduced and was stable the wound was thoroughly irrigated with fibrillar placed along the posterior capsule and medial neck. The deep fascia ws closed using a heavy Quill after infiltration of 30 cc of quarter percent Sensorcaine with epinephrine diluted with Exparel throughout the case .3-0 V-loc to close the skin with skin staples.  Incisional wound VAC applied and patient was sent to recovery in stable condition.  ? ?PLAN OF CARE: Admit for overnight observation ? ?

## 2021-12-07 NOTE — Progress Notes (Signed)
?   12/07/21 0700  ?Clinical Encounter Type  ?Visited With Patient and family together  ?Visit Type Initial;Pre-op  ?Spiritual Encounters  ?Spiritual Needs Prayer  ? ?Chaplain provided pre-op support through meaningful conversation and prayer. ?

## 2021-12-07 NOTE — TOC Progression Note (Signed)
Transition of Care (TOC) - Progression Note  ? ? ?Patient Details  ?Name: Inari Shin ?MRN: [id number] ?Date of Birth: 12/22/1950 ? ?Transition of Care (TOC) CM/SW Contact  ?Conception Oms, RN ?Phone Number: ?12/07/2021, 1:36 PM ? ?Clinical Narrative:    ?Set up with [facility] for [facility] PT, PT to eval and TOC to follow for assistance ? ? ?  ?  ? ?Expected Discharge Plan and Services ?  ?  ?  ?  ?  ?                ?  ?  ?  ?  ?  ?  ?  ?  ?  ?  ? ? ?Social Determinants of Health (SDOH) Interventions ?  ? ?Readmission Risk Interventions ?   ? View : No data to display.  ?  ?  ?  ? ? ?
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
## 2021-12-07 NOTE — Transfer of Care (Signed)
Immediate Anesthesia Transfer of Care Note ? ?Patient: Kara Hill ? ?Procedure(s) Performed: TOTAL HIP ARTHROPLASTY ANTERIOR APPROACH WITH CELL SAVER (Right: Hip) ? ?Patient Location: PACU ? ?Anesthesia Type:Regional and Spinal ? ?Level of Consciousness: awake, alert , oriented and patient cooperative ? ?Airway & Oxygen Therapy: Patient Spontanous Breathing ? ?Post-op Assessment: Report given to RN, Post -op Vital signs reviewed and stable and Patient moving all extremities X 4 ? ?Post vital signs: Reviewed ? ?Last Vitals:  ?Vitals Value Taken Time  ?BP 115/48 12/07/21 0943  ?Temp 36.1 ?C 12/07/21 0937  ?Pulse 83 12/07/21 0945  ?Resp 17 12/07/21 0945  ?SpO2 100 % 12/07/21 0945  ?Vitals shown include unvalidated device data. ? ?Last Pain:  ?Vitals:  ? 12/07/21 0642  ?TempSrc: Temporal  ?PainSc: 0-No pain  ?   ? ?  ? ?Complications: No notable events documented. ?

## 2021-12-07 NOTE — Anesthesia Preprocedure Evaluation (Addendum)
Anesthesia Evaluation  ?Patient identified by MRN, date of birth, ID band ?Patient awake ? ? ? ?Reviewed: ?Allergy & Precautions, NPO status , Patient's Chart, lab work & pertinent test results ? ?History of Anesthesia Complications ?Negative for: history of anesthetic complications ? ?Airway ?Mallampati: I ? ? ?Neck ROM: Full ? ? ? Dental ? ?(+) Missing ?  ?Pulmonary ? ?Sarcoidosis  ?  ?Pulmonary exam normal ?breath sounds clear to auscultation ? ? ? ? ? ? Cardiovascular ?hypertension, Normal cardiovascular exam ?Rhythm:Regular Rate:Normal ? ?ECG 11/27/21:  ?Sinus bradycardia (HR 55) ?Otherwise normal ECG ?  ?Neuro/Psych ?negative neurological ROS ?   ? GI/Hepatic ?negative GI ROS,   ?Endo/Other  ?Prediabetes; class 3 obesity ? Renal/GU ?negative Renal ROS  ? ?  ?Musculoskeletal ? ?(+) Arthritis ,  ? Abdominal ?  ?Peds ? Hematology ?negative hematology ROS ?(+)   ?Anesthesia Other Findings ? ? Reproductive/Obstetrics ? ?  ? ? ? ? ? ? ? ? ? ? ? ? ? ?  ?  ? ? ? ? ? ? ? ?Anesthesia Physical ?Anesthesia Plan ? ?ASA: 3 ? ?Anesthesia Plan: General and Spinal  ? ?Post-op Pain Management:   ? ?Induction: Intravenous ? ?PONV Risk Score and Plan: 3 and Propofol infusion, TIVA, Treatment may vary due to age or medical condition and Ondansetron ? ?Airway Management Planned: Natural Airway and Nasal Cannula ? ?Additional Equipment:  ? ?Intra-op Plan:  ? ?Post-operative Plan:  ? ?Informed Consent: I have reviewed the patients History and Physical, chart, labs and discussed the procedure including the risks, benefits and alternatives for the proposed anesthesia with the patient or authorized representative who has indicated his/her understanding and acceptance.  ? ? ? ? ? ?Plan Discussed with: CRNA ? ?Anesthesia Plan Comments: (Plan for spinal and GA with natural airway, LMA/GETA backup.  Patient consented for risks of anesthesia including but not limited to:  ?- adverse reactions to medications ?-  damage to eyes, teeth, lips or other oral mucosa ?- nerve damage due to positioning  ?- sore throat or hoarseness ?- headache, bleeding, infection, nerve damage 2/2 spinal ?- damage to heart, brain, nerves, lungs, other parts of body or loss of life ? ?Informed patient about role of CRNA in peri- and intra-operative care.  Patient voiced understanding.)  ? ? ? ? ? ? ?Anesthesia Quick Evaluation ? ?

## 2021-12-08 ENCOUNTER — Encounter: Payer: Self-pay | Admitting: Orthopedic Surgery

## 2021-12-08 DIAGNOSIS — M1651 Unilateral post-traumatic osteoarthritis, right hip: Secondary | ICD-10-CM | POA: Diagnosis not present

## 2021-12-08 LAB — BASIC METABOLIC PANEL
Anion gap: 4 — ABNORMAL LOW (ref 5–15)
BUN: 15 mg/dL (ref 8–23)
CO2: 27 mmol/L (ref 22–32)
Calcium: 8 mg/dL — ABNORMAL LOW (ref 8.9–10.3)
Chloride: 104 mmol/L (ref 98–111)
Creatinine, Ser: 1 mg/dL (ref 0.44–1.00)
GFR, Estimated: >60 mL/min (ref >60)
Glucose, Bld: 100 mg/dL — ABNORMAL HIGH (ref 70–99)
Potassium: 3.8 mmol/L (ref 3.5–5.1)
Sodium: 135 mmol/L (ref 135–145)

## 2021-12-08 LAB — CBC
HCT: 29.2 % — ABNORMAL LOW (ref 36.0–46.0)
Hemoglobin: 9 g/dL — ABNORMAL LOW (ref 12.0–15.0)
MCH: 28.9 pg (ref 26.0–34.0)
MCHC: 30.8 g/dL (ref 30.0–36.0)
MCV: 93.9 fL (ref 80.0–100.0)
Platelets: 181 10*3/uL (ref 150–400)
RBC: 3.11 MIL/uL — ABNORMAL LOW (ref 3.87–5.11)
RDW: 14.3 % (ref 11.5–15.5)
WBC: 6.3 10*3/uL (ref 4.0–10.5)
nRBC: 0 % (ref 0.0–0.2)

## 2021-12-08 MED ORDER — ENOXAPARIN SODIUM 40 MG/0.4ML IJ SOSY: 40.0000 mg | PREFILLED_SYRINGE | INTRAMUSCULAR | 0 refills | Status: AC

## 2021-12-08 MED ORDER — TRAMADOL HCL 50 MG PO TABS
ORAL_TABLET | ORAL | Status: AC
  Filled 2021-12-08: qty 1

## 2021-12-08 MED ORDER — SODIUM CHLORIDE 0.9 % IV BOLUS
1000.0000 mL | Freq: Once | INTRAVENOUS | Status: AC
  Administered 2021-12-08: 1000 mL via INTRAVENOUS

## 2021-12-08 MED ORDER — DOCUSATE SODIUM 100 MG PO CAPS: 100.0000 mg | ORAL_CAPSULE | Freq: Two times a day (BID) | ORAL | 0 refills | Status: AC

## 2021-12-08 MED ORDER — FE FUMARATE-B12-VIT C-FA-IFC PO CAPS
1.0000 | ORAL_CAPSULE | Freq: Two times a day (BID) | ORAL | Status: DC
  Administered 2021-12-08: 1 via ORAL
  Filled 2021-12-08 (×2): qty 1

## 2021-12-08 MED ORDER — ONDANSETRON HCL 4 MG/2ML IJ SOLN
INTRAMUSCULAR | Status: AC
  Filled 2021-12-08: qty 2

## 2021-12-08 MED ORDER — ONDANSETRON HCL 4 MG PO TABS: 4.0000 mg | ORAL_TABLET | Freq: Four times a day (QID) | ORAL | 0 refills | Status: AC | PRN

## 2021-12-08 MED ORDER — HYDROCODONE-ACETAMINOPHEN 7.5-325 MG PO TABS
ORAL_TABLET | ORAL | Status: AC
  Filled 2021-12-08: qty 1

## 2021-12-08 MED ORDER — PANTOPRAZOLE SODIUM 40 MG PO TBEC
DELAYED_RELEASE_TABLET | ORAL | Status: AC
  Administered 2021-12-08: 40 mg via ORAL
  Filled 2021-12-08: qty 1

## 2021-12-08 MED ORDER — DOCUSATE SODIUM 100 MG PO CAPS
ORAL_CAPSULE | ORAL | Status: AC
  Filled 2021-12-08: qty 1

## 2021-12-08 MED ORDER — METHOCARBAMOL 500 MG PO TABS: 500.0000 mg | ORAL_TABLET | Freq: Four times a day (QID) | ORAL | 0 refills | Status: AC | PRN

## 2021-12-08 MED ORDER — ENOXAPARIN SODIUM 40 MG/0.4ML IJ SOSY
PREFILLED_SYRINGE | INTRAMUSCULAR | Status: AC
  Administered 2021-12-08: 40 mg via SUBCUTANEOUS
  Filled 2021-12-08: qty 0.4

## 2021-12-08 MED ORDER — HYDROCODONE-ACETAMINOPHEN 7.5-325 MG PO TABS: 1.0000 | ORAL_TABLET | ORAL | 0 refills | Status: AC | PRN

## 2021-12-08 NOTE — Progress Notes (Signed)
Rolling walker and 3 in 1 to be delivered by [employer] to the bedside, ?[facility] is set up  ?Lives with her adult children, can afford her medication ?No additional needs ? ?

## 2021-12-08 NOTE — Progress Notes (Signed)
Foley catheter removed at 0816.  ?

## 2021-12-08 NOTE — Progress Notes (Signed)
?  Subjective: ?1 Day Post-Op Procedure(s) (LRB): ?TOTAL HIP ARTHROPLASTY ANTERIOR APPROACH WITH CELL SAVER (Right) ?APPLICATION OF CELL SAVER (Right) ?Patient reports pain as well-controlled.   ?Patient is well, and has had no acute complaints or problems ?Plan is to go Home after hospital stay. ?Negative for chest pain and shortness of breath ?Fever: no ?Gastrointestinal: negative for nausea and vomiting.  Patient has not had a bowel movement. ? ?Objective: ?Vital signs in last 24 hours: ?Temp:  [93.9 ?F (34.4 ?C)-98.5 ?F (36.9 ?C)] 97.6 ?F (36.4 ?C) (04/14 0800) ?Pulse Rate:  [58-94] 67 (04/14 0800) ?Resp:  [10-24] 17 (04/14 0800) ?BP: (67-124)/(43-65) 104/50 (04/14 0800) ?SpO2:  [93 %-100 %] 96 % (04/14 0800) ?Weight:  [109 kg] 109 kg (04/13 0950) ? ?Intake/Output from previous day: ? ?Intake/Output Summary (Last 24 hours) at 12/08/2021 0858 ?Last data filed at 12/08/2021 0815 ?Gross per 24 hour  ?Intake 2351 ml  ?Output 2750 ml  ?Net -399 ml  ?  ?Intake/Output this shift: ?Total I/O ?In: -  ?Out: 550 [Urine:550] ? ?Labs: ?Recent Labs  ?  12/07/21 ?1252 12/08/21 ?0340  ?HGB 9.7* 9.0*  ? ?Recent Labs  ?  12/07/21 ?1252 12/08/21 ?0340  ?WBC 8.4 6.3  ?RBC 3.30* 3.11*  ?HCT 31.4* 29.2*  ?PLT 213 181  ? ?Recent Labs  ?  12/07/21 ?1252 12/08/21 ?0340  ?NA  --  135  ?K  --  3.8  ?CL  --  104  ?CO2  --  27  ?BUN  --  15  ?CREATININE 1.04* 1.00  ?GLUCOSE  --  100*  ?CALCIUM  --  8.0*  ? ?No results for input(s): LABPT, INR in the last 72 hours. ? ? ?EXAM ?General - Patient is Alert, Appropriate, and Oriented ?Extremity - Neurovascular intact ?Dorsiflexion/Plantar flexion intact ?Compartment soft ?Dressing/Incision -clean, dry, Prevena in place and working  ?Motor Function - intact, moving foot and toes well on exam. Able to perform independent SLR.  ?Cardiovascular- Regular rate and rhythm, no murmurs/rubs/gallops ?Respiratory- Lungs clear to auscultation bilaterally ?Gastrointestinal- soft, nontender, and active bowel  sounds ? ? ?Assessment/Plan: ?1 Day Post-Op Procedure(s) (LRB): ?TOTAL HIP ARTHROPLASTY ANTERIOR APPROACH WITH CELL SAVER (Right) ?APPLICATION OF CELL SAVER (Right) ?Principal Problem: ?  Status post total hip replacement, right ? ?Estimated body mass index is 38.79 kg/m? as calculated from the following: ?  Height as of this encounter: '5\' 6"'$  (1.676 m). ?  Weight as of this encounter: 109 kg. ?Advance diet ?Up with therapy ? ?Discharge pending completion of PT goals.  ? ?Portable Prevena unit attached.  ? ?DVT Prophylaxis - Lovenox, Ted hose, and SCDs ?Weight-Bearing as tolerated to right leg ? ?Cassell Smiles, PA-C ?[facility] ?12/08/2021, 8:58 AM ? ?

## 2021-12-08 NOTE — Care Management Obs Status (Signed)
MEDICARE OBSERVATION STATUS NOTIFICATION ? ? ?Patient Details  ?Name: Kara Hill ?MRN: [id number] ?Date of Birth: [birth year] ? ? ?Medicare Observation Status Notification Given:  Yes ? ?Verbally reviewed ? ?Conception Oms, RN ?12/08/2021, 8:49 AM ?

## 2021-12-08 NOTE — Progress Notes (Cosign Needed)
Patient is not able to walk the distance required to go the bathroom, or he/she is unable to safely negotiate stairs required to access the bathroom.  A 3in1 BSC will alleviate this problem  

## 2021-12-08 NOTE — Discharge Summary (Addendum)
Physician Discharge Summary  ?Patient ID: ?Kara Hill ?MRN: [id number] ?DOB/AGE: October 05, 1950 71 y.o. ? ?Admit date: 12/07/2021 ?Discharge date: 12/08/2021 ? ?Admission Diagnoses:  ?Status post total hip replacement, right [Z96.641] ? ?Surgeries:Procedure(s): ?TOTAL HIP ARTHROPLASTY ANTERIOR APPROACH WITH CELL SAVER (Right) ?  ?SURGEON: Laurene Footman, MD ?  ?ASSISTANTS: None ?  ?ANESTHESIA:   spinal ?  ?EBL:  Total I/O ?In: 1000 [I.V.:900; IV Piggyback:100] ?Out: 600 [Urine:400; Blood:200] ?  ?BLOOD ADMINISTERED:none ?  ?DRAINS:  Incisional wound VAC   ?  ?LOCAL MEDICATIONS USED:  MARCAINE    and OTHER Exparel ?  ?SPECIMEN:  Source of Specimen:    Femoral head right ?  ?DISPOSITION OF SPECIMEN:  PATHOLOGY ?  ?COUNTS:  YES ?  ?TOURNIQUET:  * No tourniquets in log * ?  ?IMPLANTS: Medacta Masterloc 7 LAT with 52 mm Mpact DM cup and liner, size M metal 28 mm head ? ?Discharge Diagnoses: ?Patient Active Problem List  ? Diagnosis Date Noted  ? Status post total hip replacement, right 12/07/2021  ? ? ?Past Medical History:  ?Diagnosis Date  ? Arthritis   ? High cholesterol   ? Hypertension   ? Pneumonia 08/2021  ? Pre-diabetes   ? Sarcoidosis   ? ?  ?Transfusion:  ?  ?Consultants (if any):  ? ?Discharged Condition: Improved ? ?Hospital Course: Kara Hill is an 71 y.o. female who was admitted 12/07/2021 with a diagnosis of right hip osteoarthritis and went to the operating room on 12/07/2021 and underwent right total hip arthroplasty through anterior approach. Patient received perioperative antibiotics for prophylaxis (see below). The patient tolerated the procedure well and was transported to PACU in stable condition. After meeting PACU criteria, the patient was subsequently transferred to the Orthopaedics/Rehabilitation unit.  ? ?The patient received DVT prophylaxis in the form of early mobilization, Lovenox, TED hose, and SCDs . A sacral pad had been placed and heels were elevated off of the bed with rolled towels in  order to protect skin integrity. Foley catheter was discontinued on postoperative day #1. The surgical incision was healing well without signs of infection. ? ?Physical therapy was initiated postoperatively for transfers, gait training, and strengthening. Occupational therapy was initiated for activities of daily living and evaluation for assisted devices. Rehabilitation goals were reviewed in detail with the patient. The patient made steady progress with physical therapy and physical therapy recommended discharge to Home.  ? ?The patient achieved the preliminary goals of this hospitalization and was felt to be medically and orthopaedically appropriate for discharge. ? ?She was given perioperative antibiotics:  ?Anti-infectives (From admission, onward)  ? ? Start     Dose/Rate Route Frequency Ordered Stop  ? 12/07/21 1345  ceFAZolin (ANCEF) IVPB 2g/100 mL premix       ? 2 g ?200 mL/hr over 30 Minutes Intravenous Every 6 hours 12/07/21 1001 12/07/21 2028  ? 12/07/21 0624  ceFAZolin (ANCEF) 2-4 GM/100ML-% IVPB       ?Note to Pharmacy: Trudie Reed S: cabinet override  ?    12/07/21 0624 12/07/21 0747  ? 12/07/21 0600  ceFAZolin (ANCEF) IVPB 2g/100 mL premix       ? 2 g ?200 mL/hr over 30 Minutes Intravenous On call to O.R. 12/06/21 2206 12/07/21 0747  ? ?  ?. ? ?Recent vital signs:  ?Vitals:  ? 12/08/21 1356 12/08/21 1410  ?BP: (!) 110/58 (!) 135/54  ?Pulse: 66 75  ?Resp: 17 16  ?Temp:    ?SpO2: 100% 96%  ? ? ?  Recent laboratory studies:  ?Recent Labs  ?  12/07/21 ?1252 12/08/21 ?0340  ?WBC 8.4 6.3  ?HGB 9.7* 9.0*  ?HCT 31.4* 29.2*  ?PLT 213 181  ?K  --  3.8  ?CL  --  104  ?CO2  --  27  ?BUN  --  15  ?CREATININE 1.04* 1.00  ?GLUCOSE  --  100*  ?CALCIUM  --  8.0*  ? ? ?Diagnostic Studies: CT HIP RIGHT WO CONTRAST ? 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 Multiplanar CT image reconstructions were also generated. RADIATION DOSE REDUCTION: This exam was performed according to the departmental dose-optimization program which includes automated exposure control, adjustment of the mA and/or kV according to patient size and/or use of iterative reconstruction technique. COMPARISON:  None. FINDINGS: Bones/Joint/Cartilage Prior ORIF of the right acetabulum with plate and screw fixation construct. Hardware appears intact without evidence of fracture. No perihardware lucency or fracture is evident. Severe, end-stage osteoarthritis of the right hip with complete joint space loss in a primarily axial direction. Extensive subchondral sclerosis and cystic changes along both sides of the joint. Bulky marginal osteophytes. Small hip joint effusion. No acute fracture or dislocation. Ligaments Suboptimally assessed by CT. Muscles and Tendons Musculotendinous structures appear within normal limits by CT. Soft tissues No soft tissue swelling or fluid collection. No right inguinal lymphadenopathy. Atherosclerotic vascular calcifications are present. IMPRESSION: 1. Prior ORIF of the right acetabulum without evidence of hardware complication. 2. Severe, end-stage osteoarthritis of the right hip. Electronically Signed   By: Davina Poke D.O.   On: 11/15/2021 16:49  ? ?DG C-Arm 1-60 Min-No Report ? ?Result Date: 12/07/2021 ?Fluoroscopy was utilized by the requesting physician.  No radiographic interpretation.  ? ?DG C-Arm 1-60 Min-No Report ? ?Result Date: 12/07/2021 ?Fluoroscopy was utilized by the requesting physician.  No radiographic interpretation.  ? ?MM 3D SCREEN BREAST BILATERAL ? ?Result Date: 12/04/2021 ?CLINICAL DATA:  Screening. EXAM: DIGITAL SCREENING BILATERAL MAMMOGRAM WITH TOMOSYNTHESIS AND CAD TECHNIQUE: Bilateral screening digital craniocaudal and mediolateral oblique mammograms were obtained. Bilateral screening digital breast  tomosynthesis was performed. The images were evaluated with computer-aided detection. COMPARISON:  Previous exam(s). ACR Breast Density Category b: There are scattered areas of fibroglandular density. FINDINGS: There are no findings suspicious for malignancy. IMPRESSION: No mammographic evidence of malignancy. A result letter of this screening mammogram will be mailed directly to the patient. RECOMMENDATION: Screening mammogram in one year. (Code:SM-B-01Y) BI-RADS CATEGORY  1: Negative. Electronically Signed   By: Fidela Salisbury M.D.   On: 12/04/2021 08:36  ? ?DG HIP UNILAT WITH PELVIS 1V RIGHT ? ?Result Date: 12/07/2021 ?CLINICAL DATA:  Right hip replacement, intraoperative imaging EXAM: DG HIP (WITH OR WITHOUT PELVIS) 1V RIGHT COMPARISON:  11/14/2021 FINDINGS: Previous acetabular fixation hardware noted. Advanced degenerative arthropathy of the right hip. Right hip bipolar arthroplasty changes. Components appear aligned in the frontal plane. No complicating feature. IMPRESSION: Expected postoperative appearance following right total hip arthroplasty. Electronically Signed   By: Jerilynn Mages.  Shick M.D.   On: 12/07/2021 09:39  ? ?DG HIP UNILAT W OR W/O PELVIS 2-3 VIEWS RIGHT ? ?Result Date: 12/07/2021 ?CLINICAL DATA:  Status post right hip replacement EXAM: DG HIP (WITH OR WITHOUT PELVIS) 2-3V RIGHT COMPARISON:  None. FINDINGS: Status post right hip total arthroplasty with plate and screw fixation of the acetabulum and expected  overlying postoperative change. No perihardware fracture or component malpositioning. IMPRESSION: Status post right hip total arthroplasty. No perihardware fracture or component malpositioning. Electronically Signed   By: Delanna Ahmadi M.D.   On: 12/07/2021 11:04  ? ?MM Outside Films Mammo ? ?Result Date: 12/04/2021 ?This examination belongs to an outside facility and is stored here for comparison purposes only.  Contact the originating outside institution for any associated report or  interpretation. ? ?MM Outside Films Mammo ? ?Result Date: 12/04/2021 ?This examination belongs to an outside facility and is stored here for comparison purposes only.  Contact the originating outside institution for any asso

## 2021-12-08 NOTE — Progress Notes (Signed)
Physical Therapy Treatment ?Patient Details ?Name: Kara Hill ?MRN: [id number] ?DOB: 1951-05-15 ?Today's Date: 12/08/2021 ? ? ?History of Present Illness Pt is s/p R THA with anterior approach on 12/07/21.  Pt has PMH including:  HTN, and arthritis. ? ?  ?PT Comments  ? ? Pt was long sitting in bed, awake and oriented, upon arriving. She agrees to session and is cooperative and motivated throughout. " I want to get up and get moving so I can go home today." Pt was able to exit bed without physical assistance. BP stable throughout with MAP staying > 78 throughout all positional changes. She was easily able to stand and ambulate ~ 150 ft without LOB or safety concerns. Returned to room and reviewed HEP and precautions. Pt will benefit from continued skilled PT at DC to address deficits while assisting pt to PLOF. ?  ?Recommendations for follow up therapy are one component of a multi-disciplinary discharge planning process, led by the attending physician.  Recommendations may be updated based on patient status, additional functional criteria and insurance authorization. ? ?Follow Up Recommendations ? Home health PT ?  ?  ?Assistance Recommended at Discharge PRN  ?Patient can return home with the following A little help with walking and/or transfers;A little help with bathing/dressing/bathroom ?  ?Equipment Recommendations ? Rolling walker (2 wheels);Other (comment)  ?  ?   ?Precautions / Restrictions Precautions ?Precautions: Anterior Hip ?Precaution Booklet Issued: Yes (comment) ?Restrictions ?Weight Bearing Restrictions: Yes ?RLE Weight Bearing: Weight bearing as tolerated  ?  ? ?Mobility ? Bed Mobility ?Overal bed mobility: Needs Assistance ?Bed Mobility: Supine to Sit, Sit to Supine ?  ?  ?Supine to sit: Supervision (+ increased time) ?Sit to supine: Min assist ?  ?General bed mobility comments: Min asisst to lift RLE into bed. Vcs for technique imporvements. ?  ? ?Transfers ?Overall transfer level: Needs  assistance ?Equipment used: Rolling walker (2 wheels) ?Transfers: Sit to/from Stand ?Sit to Stand: Supervision ?  ?  ?  ?  ?  ?General transfer comment: no physical assistance to stand or sit to/from RW ?  ? ?Ambulation/Gait ?Ambulation/Gait assistance: Supervision ?Gait Distance (Feet): 150 Feet ?Assistive device: Rolling walker (2 wheels) ?Gait Pattern/deviations: Step-through pattern ?Gait velocity: decreased ?  ?  ?General Gait Details: Pt was safely and easily able to ambulate 150 ft with RW. no LOB or safety concerns ? ? ?Stairs ?Stairs: Yes ?  ?  ?  ?General stair comments: pt does not have stairs however pt has small threshold to enter home. discussed technique to perform and pt states " Donnald Garre been doing it that way already." ? ?  ?Balance Overall balance assessment: Needs assistance ?Sitting-balance support: No upper extremity supported, Feet supported ?Sitting balance-Leahy Scale: Good ?  ?  ?Standing balance support: Bilateral upper extremity supported ?Standing balance-Leahy Scale: Good ?  ?  ?  ?  ?Cognition Arousal/Alertness: Awake/alert ?Behavior During Therapy: [facility] for tasks assessed/performed ?Overall Cognitive Status: Within Functional Limits for tasks assessed ?  ?   ?General Comments: Pt is A and O x 4. motivated and cooperative ?  ?  ? ?  ?Exercises Total Joint Exercises ?Ankle Circles/Pumps: AROM, Strengthening, Both, 10 reps, Supine ?Quad Sets: AROM, Strengthening, Both, 10 reps, Supine ?Gluteal Sets: AROM, Strengthening, Both, 10 reps, Supine ?Towel Squeeze: AROM, Strengthening, Both, 10 reps, Supine ?Heel Slides: AROM, Strengthening, Both, 10 reps, Supine ?Hip ABduction/ADduction: AROM, Strengthening, Both, 10 reps, Supine ?Straight Leg Raises: AROM, 10 reps ? ?  ?   ? ?  Pertinent Vitals/Pain Pain Assessment ?Pain Assessment: 0-10 ?Pain Score: 4  ?Pain Location: R hip ?Pain Descriptors / Indicators: Discomfort ?Pain Intervention(s): Limited activity within patient's tolerance, Monitored during  session, Repositioned, Ice applied  ? ? ? ?PT Goals (current goals can now be found in the care plan section) Acute Rehab PT Goals ?Patient Stated Goal: to go home. ?Progress towards PT goals: Progressing toward goals ? ?  ?Frequency ? ? ? BID ? ? ? ?  ?PT Plan Current plan remains appropriate  ? ? ?   ?AM-PAC PT "6 Clicks" Mobility   ?Outcome Measure ? Help needed turning from your back to your side while in a flat bed without using bedrails?: A Little ?Help needed moving from lying on your back to sitting on the side of a flat bed without using bedrails?: A Little ?Help needed moving to and from a bed to a chair (including a wheelchair)?: A Little ?Help needed standing up from a chair using your arms (e.g., wheelchair or bedside chair)?: A Little ?Help needed to walk in hospital room?: A Little ?Help needed climbing 3-5 steps with a railing? : A Little ?6 Click Score: 18 ? ?  ?End of Session   ?Activity Tolerance: Patient tolerated treatment well ?Patient left: in bed;with call bell/phone within reach;with bed alarm set;with nursing/sitter in room ?Nurse Communication: Mobility status ?PT Visit Diagnosis: Unsteadiness on feet (R26.81);Other abnormalities of gait and mobility (R26.89);Muscle weakness (generalized) (M62.81);Difficulty in walking, not elsewhere classified (R26.2);Pain ?Pain - Right/Left: Right ?Pain - part of body: Hip ?  ? ? ?Time: 0722-0800 ?PT Time Calculation (min) (ACUTE ONLY): 38 min ? ?Charges:  $Gait Training: 8-22 mins ?$Therapeutic Exercise: 8-22 mins ?$Therapeutic Activity: 8-22 mins          ?          ? ?Julaine Fusi PTA ?12/08/21, 8:12 AM  ? ?

## 2021-12-08 NOTE — Progress Notes (Signed)
Physical Therapy Treatment ?Patient Details ?Name: Kara Hill ?MRN: [id number] ?DOB: 30-Mar-1951 ?Today's Date: 12/08/2021 ? ? ?History of Present Illness Pt is s/p R THA with anterior approach on 12/07/21.  Pt has PMH including:  HTN, and arthritis. ? ?  ?PT Comments  ? ? The pt presents in great spirits ready to demonstrate her improved tolerance to mobility. The pt demonstrates improved independence with achieving EOB and also demonstrates decreased caregiver burden during ambulation. It is important to note that the pt scores 21/28 on the Tinetti balance & gait assessment, mostly d/t to need for AD at this time, indicating a moderate fall risk and need for HHPT at d/c. Based on performance the pt is demonstrating safety to d/c home with family care and HHPT at this time.  ?   ?Recommendations for follow up therapy are one component of a multi-disciplinary discharge planning process, led by the attending physician.  Recommendations may be updated based on patient status, additional functional criteria and insurance authorization. ? ?Follow Up Recommendations ? Home health PT ?  ?  ?Assistance Recommended at Discharge PRN  ?Patient can return home with the following A little help with walking and/or transfers;A little help with bathing/dressing/bathroom ?  ?Equipment Recommendations ? Rolling walker (2 wheels)  ?  ?Recommendations for Other Services   ? ? ?  ?Precautions / Restrictions Precautions ?Precaution Booklet Issued: Yes (comment) ?Restrictions ?Weight Bearing Restrictions: Yes ?RLE Weight Bearing: Weight bearing as tolerated  ?  ? ?Mobility ? Bed Mobility ?Overal bed mobility: Independent ?Bed Mobility: Supine to Sit ?  ?  ?Supine to sit: Modified independent (Device/Increase time) ?  ?  ?  ?  ? ?Transfers ?Overall transfer level: Modified independent ?Equipment used: Rolling walker (2 wheels) ?Transfers: Sit to/from Stand ?Sit to Stand: Supervision ?  ?  ?  ?  ?  ?  ?  ? ?Ambulation/Gait ?Ambulation/Gait  assistance: Supervision ?Gait Distance (Feet): 150 Feet ?Assistive device: Rolling walker (2 wheels) ?Gait Pattern/deviations: Step-through pattern ?  ?  ?  ?General Gait Details: Pt was safely and easily able to ambulate 150 ft with RW. no LOB or safety concerns ? ? ?Stairs ?  ?  ?  ?  ?General stair comments: Pt able to repeat back appropriate approach to navigating steps. ? ? ?Wheelchair Mobility ?  ? ?Modified Rankin (Stroke Patients Only) ?  ? ? ?  ?Balance   ?  ?Sitting balance-Leahy Scale: Normal ?  ?  ?Standing balance support: Bilateral upper extremity supported, During functional activity ?Standing balance-Leahy Scale: Good ?  ?  ?  ?  ?  ?  ?  ?  ?  ?  ?  ?  ?  ? ?  ?Cognition Arousal/Alertness: Awake/alert ?Behavior During Therapy: [facility] for tasks assessed/performed ?Overall Cognitive Status: Within Functional Limits for tasks assessed ?  ?  ?  ?  ?  ?  ?  ?  ?  ?  ?  ?  ?  ?  ?  ?  ?General Comments: Pt is A and O x 4. motivated and cooperative ?  ?  ? ?  ?Exercises   ? ?  ?General Comments   ?  ?  ? ?Pertinent Vitals/Pain Pain Assessment ?Pain Assessment: 0-10  ? ? ?Home Living   ?  ?  ?  ?  ?  ?  ?  ?  ?  ?   ?  ?Prior Function    ?  ?  ?   ? ?  PT Goals (current goals can now be found in the care plan section) Acute Rehab PT Goals ?Patient Stated Goal: to go home. ?PT Goal Formulation: With patient ?Time For Goal Achievement: 12/21/21 ?Potential to Achieve Goals: Good ?Progress towards PT goals: Progressing toward goals ? ?  ?Frequency ? ? ? BID ? ? ? ?  ?PT Plan Current plan remains appropriate  ? ? ?Co-evaluation   ?  ?  ?  ?  ? ?  ?AM-PAC PT "6 Clicks" Mobility   ?Outcome Measure ? Help needed turning from your back to your side while in a flat bed without using bedrails?: A Little ?Help needed moving from lying on your back to sitting on the side of a flat bed without using bedrails?: A Little ?Help needed moving to and from a bed to a chair (including a wheelchair)?: A Little ?Help needed standing  up from a chair using your arms (e.g., wheelchair or bedside chair)?: A Little ?Help needed to walk in hospital room?: A Little ?Help needed climbing 3-5 steps with a railing? : A Little ?6 Click Score: 18 ? ?  ?End of Session   ?Activity Tolerance: Patient tolerated treatment well ?Patient left: with nursing/sitter in room;with family/visitor present;in bed (Sitting at EOB) ?Nurse Communication: Mobility status ?PT Visit Diagnosis: Unsteadiness on feet (R26.81);Other abnormalities of gait and mobility (R26.89);Muscle weakness (generalized) (M62.81);Difficulty in walking, not elsewhere classified (R26.2);Pain ?Pain - Right/Left: Right ?Pain - part of body: Hip ?  ? ? ?Time: 4008-6761 ?PT Time Calculation (min) (ACUTE ONLY): 19 min ? ?Charges:  $Gait Training: 8-22 mins          ?          ? ?2:37 PM, 12/08/21 ?Naylah Cork A. Saverio Danker, PT, DPT ?Physical Therapist - [place] ?[facility] ? ? ? ?Vista Sawatzky A Latana Colin ?12/08/2021, 2:32 PM ? ?

## 2021-12-08 NOTE — Evaluation (Signed)
Occupational Therapy Evaluation ?Patient Details ?Name: Kara Hill ?MRN: [id number] ?DOB: 30-Dec-1950 ?Today's Date: 12/08/2021 ? ? ?History of Present Illness Pt is s/p R THA with anterior approach on 12/07/21.  Pt has PMH including:  HTN, and arthritis.  ? ?Clinical Impression ?  ?Pt seen for OT evaluation this date, POD#1 from above surgery. Pt was independent in all ADL/IADL prior to surgery is eager to return to PLOF with less pain and improved safety and independence. Pt currently requires PRN minimal assist for LB dressing and bathing while in seated position due to pain and limited AROM of R hip. Pt instructed in self care skills, falls prevention strategies, home/routines modifications, DME/AE for LB bathing and dressing tasks, and compression stocking mgt strategies. Pt completed bed mobility with MIN A only for RLE mgt back to bed otherwise supervision, supervision to stand with RW. Supine BP 109/56, sitting BP 115/58 and pt became nauseated. This improved with 3+min seated. In standing BP 106/67 pt had a return of nausea and lightheadedness that did not resolve with standing and returned to sitting before 71mn standing BP was obtained. RN notified and in room at end of session to provide Zofran. Care team notified. Pt would benefit from additional instruction in self care skills and techniques while hospitalized to help maintain precautions with or without assistive devices to support recall and carryover prior to discharge. Do not anticipate skilled OT needs upon discharge.    ? ?Recommendations for follow up therapy are one component of a multi-disciplinary discharge planning process, led by the attending physician.  Recommendations may be updated based on patient status, additional functional criteria and insurance authorization.  ? ?Follow Up Recommendations ? No OT follow up  ?  ?Assistance Recommended at Discharge PRN  ?Patient can return home with the following A little help with  bathing/dressing/bathroom;Assistance with cooking/housework;Assist for transportation;Help with stairs or ramp for entrance ? ?  ?Functional Status Assessment ? Patient has had a recent decline in their functional status and demonstrates the ability to make significant improvements in function in a reasonable and predictable amount of time.  ?Equipment Recommendations ? Other (comment) (reacher)  ?  ?Recommendations for Other Services   ? ? ?  ?Precautions / Restrictions Precautions ?Precautions: Anterior Hip ?Precaution Booklet Issued: Yes (comment) ?Restrictions ?Weight Bearing Restrictions: Yes ?RLE Weight Bearing: Weight bearing as tolerated  ? ?  ? ?Mobility Bed Mobility ?Overal bed mobility: Needs Assistance ?Bed Mobility: Supine to Sit, Sit to Supine ?  ?  ?Supine to sit: Supervision ?Sit to supine: Min assist ?  ?General bed mobility comments: increased time, MIN A for RLE back to bed, educated in hook technique ?  ? ?Transfers ?Overall transfer level: Needs assistance ?Equipment used: Rolling walker (2 wheels) ?Transfers: Sit to/from Stand ?Sit to Stand: Supervision ?  ?  ?  ?  ?  ?General transfer comment: VC for hand placement ?  ? ?  ?Balance Overall balance assessment: Needs assistance ?Sitting-balance support: No upper extremity supported, Feet supported ?Sitting balance-Leahy Scale: Good ?  ?  ?Standing balance support: Bilateral upper extremity supported, Single extremity supported ?Standing balance-Leahy Scale: Good ?  ?  ?  ?  ?  ?  ?  ?  ?  ?  ?  ?  ?   ? ?ADL either performed or assessed with clinical judgement  ? ?ADL Overall ADL's : Needs assistance/impaired ?  ?  ?  ?  ?  ?  ?  ?  ?  ?  ?  ?  ?  ?  ?  ?  ?  ?  ?  ?  General ADL Comments: Pt currently mod indep with ADL tasks, requires PRN assist for compression stocking mgt and pt reports nurse tech dtr can provide assist.  ? ? ? ?Vision   ?   ?   ?Perception   ?  ?Praxis   ?  ? ?Pertinent Vitals/Pain Pain Assessment ?Pain Assessment: 0-10 ?Pain  Score: 6  ?Pain Location: R hip ?Pain Descriptors / Indicators: Discomfort ?Pain Intervention(s): Limited activity within patient's tolerance, Monitored during session, Premedicated before session, Repositioned  ? ? ? ?Hand Dominance Right ?  ?Extremity/Trunk Assessment Upper Extremity Assessment ?Upper Extremity Assessment: Overall WFL for tasks assessed ?  ?Lower Extremity Assessment ?Lower Extremity Assessment: RLE deficits/detail ?RLE Deficits / Details: weakness s/p R THA with anterior approach. ?  ?  ?  ?Communication Communication ?Communication: No difficulties ?  ?Cognition Arousal/Alertness: Awake/alert ?Behavior During Therapy: [facility] for tasks assessed/performed ?Overall Cognitive Status: Within Functional Limits for tasks assessed ?  ?  ?  ?  ?  ?  ?  ?  ?  ?  ?  ?  ?  ?  ?  ?  ?  ?  ?  ?General Comments  supine 109/56, sitting 115/58 and became nauseated and improved with 3+min seated, standing 106/67 with return of nausea and lightheadedness that did not resolve with standing and returned to sitting before 109mn standing BP was obtained. RN notified and in room at end of session to provide Zofran. Care team notified. ? ?  ?Exercises Other Exercises ?Other Exercises: Pt educated in home/routines modifications, falls prevention, AE/DME for ADL, ADL transfers, and compression stocking mgt, handout provided ?  ?Shoulder Instructions    ? ? ?Home Living Family/patient expects to be discharged to:: Private residence ?Living Arrangements: Children (Daughter & Son) ?Available Help at Discharge: Family;Available PRN/intermittently (Daughter works 12 hours shifts, goes to school, and works at [employer] ?Type of Home: House ?Home Access: Level entry (1 stoop) ?  ?  ?Home Layout: One level ?  ?  ?Bathroom Shower/Tub: Walk-in shower ?  ?Bathroom Toilet: Standard ?  ?  ?Home Equipment: Toilet riser;Cane - single point;BSC/3in1;Shower seat - built in;Hand held shower head ?  ?  ?  ? ?  ?Prior  Functioning/Environment Prior Level of Function : Independent/Modified Independent;Driving ?  ?  ?  ?  ?  ?  ?  ?ADLs Comments: stays active doing housekeeping tasks, yard work/gardening ?  ? ?  ?  ?OT Problem List: Decreased strength;Pain;Impaired balance (sitting and/or standing) ?  ?   ?OT Treatment/Interventions: Self-care/ADL training;Therapeutic exercise;Therapeutic activities;DME and/or AE instruction;Patient/family education;Balance training  ?  ?OT Goals(Current goals can be found in the care plan section) Acute Rehab OT Goals ?Patient Stated Goal: go home ?OT Goal Formulation: With patient ?Time For Goal Achievement: 12/22/21 ?Potential to Achieve Goals: Good ?ADL Goals ?Pt Will Perform Lower Body Dressing: with modified independence;sit to/from stand ?Pt Will Transfer to Toilet: with modified independence;ambulating (elevated, commode, LRAD) ?Additional ADL Goal #1: Pt will independently instruct family in compression stocking mgt ?Additional ADL Goal #2: Pt will complete all aspects of bathing with modified independence.  ?OT Frequency: Min 2X/week ?  ? ?Co-evaluation   ?  ?  ?  ?  ? ?  ?AM-PAC OT "6 Clicks" Daily Activity     ?Outcome Measure Help from another person eating meals?: None ?Help from another person taking care of personal grooming?: None ?Help from another person toileting, which includes using toliet, bedpan, or urinal?: A  Little ?Help from another person bathing (including washing, rinsing, drying)?: A Little ?Help from another person to put on and taking off regular upper body clothing?: None ?Help from another person to put on and taking off regular lower body clothing?: A Little ?6 Click Score: 21 ?  ?End of Session Equipment Utilized During Treatment: Rolling walker (2 wheels) ?Nurse Communication: Other (comment) (nausea) ? ?Activity Tolerance: Treatment limited secondary to medical complications (Comment) ?Patient left: in bed;with call bell/phone within reach;with bed alarm  set;with nursing/sitter in room ? ?OT Visit Diagnosis: Other abnormalities of gait and mobility (R26.89);Pain ?Pain - Right/Left: Right ?Pain - part of body: Hip  ?              ?Time: 1173-5670 ?OT Time Calculation (min): 36 min ?Charges:

## 2021-12-11 LAB — SURGICAL PATHOLOGY

## 2022-03-16 ENCOUNTER — Encounter: Payer: Self-pay | Admitting: Gastroenterology

## 2022-03-16 ENCOUNTER — Ambulatory Visit: Payer: Medicare HMO | Admitting: Anesthesiology

## 2022-03-16 ENCOUNTER — Encounter
Admission: RE | Disposition: A | Discharge status: Home / Self Care | Payer: Self-pay | Source: Home / Self Care | Attending: Gastroenterology

## 2022-03-16 ENCOUNTER — Ambulatory Visit
Admission: RE | Admit: 2022-03-16 | Discharge: 2022-03-16 | Disposition: A | Discharge status: Home / Self Care | Payer: Medicare HMO | Attending: Gastroenterology | Admitting: Gastroenterology

## 2022-03-16 DIAGNOSIS — K5730 Diverticulosis of large intestine without perforation or abscess without bleeding: Secondary | ICD-10-CM | POA: Insufficient documentation

## 2022-03-16 DIAGNOSIS — D124 Benign neoplasm of descending colon: Secondary | ICD-10-CM | POA: Diagnosis not present

## 2022-03-16 DIAGNOSIS — K219 Gastro-esophageal reflux disease without esophagitis: Secondary | ICD-10-CM | POA: Insufficient documentation

## 2022-03-16 DIAGNOSIS — I10 Essential (primary) hypertension: Secondary | ICD-10-CM | POA: Diagnosis not present

## 2022-03-16 DIAGNOSIS — K640 First degree hemorrhoids: Secondary | ICD-10-CM | POA: Diagnosis not present

## 2022-03-16 DIAGNOSIS — D123 Benign neoplasm of transverse colon: Secondary | ICD-10-CM | POA: Diagnosis not present

## 2022-03-16 DIAGNOSIS — Z86010 Personal history of colonic polyps: Secondary | ICD-10-CM | POA: Insufficient documentation

## 2022-03-16 DIAGNOSIS — Z1211 Encounter for screening for malignant neoplasm of colon: Secondary | ICD-10-CM | POA: Diagnosis present

## 2022-03-16 DIAGNOSIS — D869 Sarcoidosis, unspecified: Secondary | ICD-10-CM | POA: Insufficient documentation

## 2022-03-16 DIAGNOSIS — D122 Benign neoplasm of ascending colon: Secondary | ICD-10-CM | POA: Diagnosis not present

## 2022-03-16 HISTORY — PX: COLONOSCOPY: SHX5424

## 2022-03-16 SURGERY — COLONOSCOPY
Anesthesia: General

## 2022-03-16 MED ORDER — GLYCOPYRROLATE 0.2 MG/ML IJ SOLN
INTRAMUSCULAR | Status: DC | PRN
  Administered 2022-03-16: .2 mg via INTRAVENOUS

## 2022-03-16 MED ORDER — SODIUM CHLORIDE 0.9 % IV SOLN: INTRAVENOUS | Status: DC

## 2022-03-16 MED ORDER — PROPOFOL 500 MG/50ML IV EMUL
INTRAVENOUS | Status: DC | PRN
  Administered 2022-03-16: 100 ug/kg/min via INTRAVENOUS

## 2022-03-16 MED ORDER — PROPOFOL 10 MG/ML IV BOLUS
INTRAVENOUS | Status: DC | PRN
  Administered 2022-03-16: 70 mg via INTRAVENOUS
  Administered 2022-03-16: 30 mg via INTRAVENOUS

## 2022-03-16 MED ORDER — STERILE WATER FOR IRRIGATION IR SOLN
Status: DC | PRN
  Administered 2022-03-16: 60 mL

## 2022-03-16 MED ORDER — LIDOCAINE HCL (CARDIAC) PF 100 MG/5ML IV SOSY
PREFILLED_SYRINGE | INTRAVENOUS | Status: DC | PRN
  Administered 2022-03-16: 100 mg via INTRAVENOUS

## 2022-03-16 NOTE — Interval H&P Note (Signed)
History and Physical Interval Note: Preprocedure H&P from 03/16/22  was reviewed and there was no interval change after seeing and examining the patient.  Written consent was obtained from the patient after discussion of risks, benefits, and alternatives. Patient has consented to proceed with Colonoscopy with possible intervention   03/16/2022 1:59 PM  Kara Hill  has presented today for surgery, with the diagnosis of History of adenomatous polyp of colon (Z86.010).  The various methods of treatment have been discussed with the patient and family. After consideration of risks, benefits and other options for treatment, the patient has consented to  Procedure(s): COLONOSCOPY (N/A) as a surgical intervention.  The patient's history has been reviewed, patient examined, no change in status, stable for surgery.  I have reviewed the patient's chart and labs.  Questions were answered to the patient's satisfaction.     [name]

## 2022-03-16 NOTE — H&P (Signed)
Pre-Procedure H&P   Patient ID: Kara Hill is a 71 y.o. female.  Gastroenterology Provider: [name], DO  Referring Provider: Dr. Tressia Miners PCP: Gladstone Lighter, MD  Date: 03/16/2022  HPI Ms. Kara Hill is a 71 y.o. female who presents today for Colonoscopy for surveillance colonoscopy- phx colon polyps. Patient undergoing surveillance colonoscopy.  Her last colonoscopy was January 2009 when she had 2-4 adenomatous polyps.  She is overdue for her surveillance.  Also noted to have sigmoid diverticulosis at that point in time.  Reports bowel movement every other day without melena hematochezia diarrhea or constipation.  No family history of colon cancer or colon polyps.  Due to sarcoidosis the patient does have dyspnea on exertion for which she uses inhalers. Patient is on 325 mg of aspirin daily which has been held for the procedure. Status post right hip replacement Status post C-section  Most recent lab work hemoglobin 12.2 MCV 93.6 platelets 325,000   Past Medical History:  Diagnosis Date   Arthritis    High cholesterol    Hypertension    Pneumonia 08/2021   Pre-diabetes    Sarcoidosis     Past Surgical History:  Procedure Laterality Date   [place]   TOTAL HIP ARTHROPLASTY Right 12/07/2021   Procedure: TOTAL HIP ARTHROPLASTY ANTERIOR APPROACH WITH CELL SAVER;  Surgeon: Hessie Knows, MD;  Location: [facility] ORS;  Service: Orthopedics;  Laterality: Right;   TUBAL LIGATION      Family History No h/o GI disease or malignancy  Review of Systems  Constitutional:  Negative for activity change, appetite change, chills, diaphoresis, fatigue, fever and unexpected weight change.  HENT:  Negative for trouble swallowing and voice change.   Respiratory:  Negative for shortness of breath and wheezing.   Cardiovascular:  Negative for chest  pain, palpitations and leg swelling.  Gastrointestinal:  Negative for abdominal distention, abdominal pain, anal bleeding, blood in stool, constipation, diarrhea, nausea, rectal pain and vomiting.  Musculoskeletal:  Negative for arthralgias and myalgias.  Skin:  Negative for color change and pallor.  Neurological:  Negative for dizziness, syncope and weakness.  Psychiatric/Behavioral:  Negative for confusion.   All other systems reviewed and are negative.    Medications No current facility-administered medications on file prior to encounter.   Current Outpatient Medications on File Prior to Encounter  Medication Sig Dispense Refill   losartan-hydrochlorothiazide (HYZAAR) 50-12.5 MG tablet Take 1 tablet by mouth daily.     albuterol (VENTOLIN HFA) 108 (90 Base) MCG/ACT inhaler Inhale 2 puffs into the lungs every 6 (six) hours as needed for shortness of breath or wheezing.     atorvastatin (LIPITOR) 40 MG tablet Take 40 mg by mouth daily.     BREO ELLIPTA 100-25 MCG/ACT AEPB Inhale 1 puff into the lungs daily.     cholecalciferol (VITAMIN D3) 25 MCG (1000 UNIT) tablet Take 1,000 Units by mouth daily.     docusate sodium (COLACE) 100 MG capsule Take 1 capsule (100 mg total) by mouth 2 (two) times daily. 10 capsule 0   enoxaparin (LOVENOX) 40 MG/0.4ML injection Inject 0.4 mLs (40 mg total) into the skin daily for 14 days. 5.6 mL 0   HYDROcodone-acetaminophen (NORCO) 7.5-325 MG tablet Take 1 tablet by mouth every 4 (four) hours as needed for moderate pain. 40 tablet 0  methocarbamol (ROBAXIN) 500 MG tablet Take 1 tablet (500 mg total) by mouth every 6 (six) hours as needed for muscle spasms. 30 tablet 0   montelukast (SINGULAIR) 10 MG tablet Take 10 mg by mouth at bedtime as needed.     Multiple Vitamins-Minerals (MULTIVITAMIN WITH MINERALS) tablet Take 1 tablet by mouth daily.     ondansetron (ZOFRAN) 4 MG tablet Take 1 tablet (4 mg total) by mouth every 6 (six) hours as needed for nausea. 20  tablet 0    Pertinent medications related to GI and procedure were reviewed by me with the patient prior to the procedure   Current Facility-Administered Medications:    0.9 %  sodium chloride infusion, , Intravenous, Continuous, [name], DO, Last Rate: 20 mL/hr at 03/16/22 1306, New Bag at 03/16/22 1306      No Known Allergies Allergies were reviewed by me prior to the procedure  Objective   Body mass index is 38.74 kg/m. Vitals:   03/16/22 1250  BP: (!) 153/77  Pulse: 61  Resp: 20  Temp: (!) 96.4 F (35.8 C)  TempSrc: Temporal  SpO2: 100%  Weight: 108.9 kg  Height: '5\' 6"'$  (1.676 m)     Physical Exam Vitals and nursing note reviewed.  Constitutional:      General: She is not in acute distress.    Appearance: Normal appearance. She is obese. She is not ill-appearing, toxic-appearing or diaphoretic.  HENT:     Head: Normocephalic and atraumatic.     Nose: Nose normal.     Mouth/Throat:     Mouth: Mucous membranes are moist.     Pharynx: Oropharynx is clear.  Eyes:     General: No scleral icterus.    Extraocular Movements: Extraocular movements intact.  Cardiovascular:     Rate and Rhythm: Normal rate and regular rhythm.     Heart sounds: Normal heart sounds. No murmur heard.    No friction rub. No gallop.  Pulmonary:     Effort: Pulmonary effort is normal. No respiratory distress.     Breath sounds: Normal breath sounds. No wheezing, rhonchi or rales.  Abdominal:     General: Bowel sounds are normal. There is no distension.     Palpations: Abdomen is soft.     Tenderness: There is no abdominal tenderness. There is no guarding or rebound.  Musculoskeletal:     Cervical back: Neck supple.     Right lower leg: No edema.     Left lower leg: No edema.  Skin:    General: Skin is warm and dry.     Coloration: Skin is not jaundiced or pale.  Neurological:     General: No focal deficit present.     Mental Status: She is alert and oriented to  person, place, and time. Mental status is at baseline.  Psychiatric:        Mood and Affect: Mood normal.        Behavior: Behavior normal.        Thought Content: Thought content normal.        Judgment: Judgment normal.      Assessment:  Ms. Kara Hill is a 71 y.o. female  who presents today for Colonoscopy for surveillance colonoscopy- phx colon polyps.  Plan:  Colonoscopy with possible intervention today  Colonoscopy with possible biopsy, control of bleeding, polypectomy, and interventions as necessary has been discussed with the patient/patient representative. Informed consent was obtained from the patient/patient representative after explaining the indication,  nature, and risks of the procedure including but not limited to death, bleeding, perforation, missed neoplasm/lesions, cardiorespiratory compromise, and reaction to medications. Opportunity for questions was given and appropriate answers were provided. Patient/patient representative has verbalized understanding is amenable to undergoing the procedure.   [name], DO  [facility]  Portions of the record may have been created with voice recognition software. Occasional wrong-word or 'sound-a-like' substitutions may have occurred due to the inherent limitations of voice recognition software.  Read the chart carefully and recognize, using context, where substitutions may have occurred.

## 2022-03-16 NOTE — Transfer of Care (Signed)
Immediate Anesthesia Transfer of Care Note  Patient: Kara Hill  Procedure(s) Performed: COLONOSCOPY  Patient Location: PACU and Endoscopy Unit  Anesthesia Type:General  Level of Consciousness: awake, drowsy and patient cooperative  Airway & Oxygen Therapy: Patient Spontanous Breathing  Post-op Assessment: Report given to RN and Post -op Vital signs reviewed and stable  Post vital signs: Reviewed and stable  Last Vitals:  Vitals Value Taken Time  BP 116/60 03/16/22 1520  Temp 35.6 C 03/16/22 1520  Pulse 75 03/16/22 1520  Resp 12 03/16/22 1520  SpO2 95 % 03/16/22 1520    Last Pain:  Vitals:   03/16/22 1520  TempSrc: Temporal  PainSc:          Complications: No notable events documented.

## 2022-03-16 NOTE — Op Note (Signed)
[facility] Gastroenterology Patient Name: Kara Hill Procedure Date: 03/16/2022 1:55 PM MRN: [id number] Account #: [id number] Date of Birth: 07-Jan-1951 Admit Type: Outpatient Age: 71 Room: [facility] ENDO ROOM 1 Gender: Female Note Status: Finalized Instrument Name: Colonoscope [id number] Procedure:             Colonoscopy Indications:           High risk colon cancer surveillance: Personal history                         of colonic polyps Providers:             [name] DO, DO Referring MD:          Gladstone Lighter, MD (Referring MD) Medicines:             Monitored Anesthesia Care Complications:         No immediate complications. Estimated blood loss:                         Minimal. Procedure:             Pre-Anesthesia Assessment:                        - Prior to the procedure, a History and Physical was                         performed, and patient medications and allergies were                         reviewed. The patient is competent. The risks and                         benefits of the procedure and the sedation options and                         risks were discussed with the patient. All questions                         were answered and informed consent was obtained.                         Patient identification and proposed procedure were                         verified by the physician, the nurse, the anesthetist                         and the technician in the endoscopy suite. Mental                         Status Examination: alert and oriented. Airway                         Examination: normal oropharyngeal airway and neck                         mobility. Respiratory Examination: clear to  auscultation. CV Examination: RRR, no murmurs, no S3                         or S4. Prophylactic Antibiotics: The patient does not                         require prophylactic antibiotics. Prior                          Anticoagulants: The patient has taken no previous                         anticoagulant or antiplatelet agents. ASA Grade                         Assessment: III - A patient with severe systemic                         disease. After reviewing the risks and benefits, the                         patient was deemed in satisfactory condition to                         undergo the procedure. The anesthesia plan was to use                         monitored anesthesia care (MAC). Immediately prior to                         administration of medications, the patient was                         re-assessed for adequacy to receive sedatives. The                         heart rate, respiratory rate, oxygen saturations,                         blood pressure, adequacy of pulmonary ventilation, and                         response to care were monitored throughout the                         procedure. The physical status of the patient was                         re-assessed after the procedure.                        After obtaining informed consent, the colonoscope was                         passed under direct vision. Throughout the procedure,                         the patient's blood pressure, pulse, and oxygen  saturations were monitored continuously. The                         Colonoscope was introduced through the anus and                         advanced to the the terminal ileum, with                         identification of the appendiceal orifice and IC                         valve. The colonoscopy was technically difficult and                         complex due to a redundant colon. The patient                         tolerated the procedure well. The quality of the bowel                         preparation was evaluated using the BBPS [facility] Bowel                         Preparation Scale) with scores of: Right Colon = 2                         (minor  amount of residual staining, small fragments of                         stool and/or opaque liquid, but mucosa seen well),                         Transverse Colon = 3 (entire mucosa seen well with no                         residual staining, small fragments of stool or opaque                         liquid) and Left Colon = 2 (minor amount of residual                         staining, small fragments of stool and/or opaque                         liquid, but mucosa seen well). The total BBPS score                         equals 7. The quality of the bowel preparation was                         good. The terminal ileum, ileocecal valve, appendiceal                         orifice, and rectum were photographed. Findings:      The perianal and digital rectal examinations were normal. Pertinent  negatives include normal sphincter tone.      The terminal ileum appeared normal. Estimated blood loss: none.      Multiple small-mouthed diverticula were found in the left colon.       Estimated blood loss: none.      Non-bleeding internal hemorrhoids were found during retroflexion. The       hemorrhoids were Grade I (internal hemorrhoids that do not prolapse).      A 7 to 9 mm polyp was found in the transverse colon. The polyp was       pedunculated. The polyp was removed with a hot snare. Resection and       retrieval were complete. Estimated blood loss was minimal.      A 5 to 6 mm polyp was found in the ascending colon. The polyp was       semi-pedunculated. The polyp was removed with a hot snare. Resection and       retrieval were complete. To prevent bleeding after the polypectomy, two       hemostatic clips were successfully placed (MR conditional). There was no       bleeding at the end of the procedure. Estimated blood loss was minimal.      Four sessile polyps were found in the rectum (2), descending colon (1)       and transverse colon (1). The polyps were 1 to 2 mm in size. These        polyps were removed with a jumbo cold forceps. Resection and retrieval       were complete. Estimated blood loss was minimal.      A 3 to 4 mm polyp was found in the ascending colon. The polyp was       sessile. The polyp was removed with a cold snare. Resection and       retrieval were complete. Estimated blood loss was minimal.      A 4 to 6 mm polyp was found in the transverse colon. The polyp was       pedunculated. The polyp was removed with a hot snare. Resection and       retrieval were complete. Estimated blood loss was minimal.      The exam was otherwise without abnormality on direct and retroflexion       views.      Several hyperplastic appearing polyps were noted in the rectum.       representative biopsies were taken as mentioned above.      The exam was otherwise without abnormality on direct and retroflexion       views. Impression:            - The examined portion of the ileum was normal.                        - Diverticulosis in the left colon.                        - Non-bleeding internal hemorrhoids.                        - One 7 to 9 mm polyp in the transverse colon, removed                         with a hot snare. Resected and retrieved.                        -  One 5 to 6 mm polyp in the ascending colon, removed                         with a hot snare. Resected and retrieved. Clips (MR                         conditional) were placed.                        - Four 1 to 2 mm polyps in the rectum, in the                         descending colon and in the transverse colon, removed                         with a jumbo cold forceps. Resected and retrieved.                        - One 3 to 4 mm polyp in the ascending colon, removed                         with a cold snare. Resected and retrieved.                        - One 4 to 6 mm polyp in the transverse colon, removed                         with a hot snare. Resected and retrieved.                         - The examination was otherwise normal on direct and                         retroflexion views.                        - The examination was otherwise normal on direct and                         retroflexion views. Recommendation:        - Discharge patient to home.                        - Resume previous diet.                        - No aspirin, ibuprofen, naproxen, or other                         non-steroidal anti-inflammatory drugs after polyp                         removal.                        - Await pathology results.                        - Repeat colonoscopy for surveillance based on  pathology results.                        - Return to referring physician as previously                         scheduled.                        - The findings and recommendations were discussed with                         the patient. Procedure Code(s):     --- Professional ---                        [phone], Colonoscopy, flexible; with removal of                         tumor(s), polyp(s), or other lesion(s) by snare                         technique                        [id number], 57, Colonoscopy, flexible; with biopsy, single                         or multiple Diagnosis Code(s):     --- Professional ---                        Z86.010, Personal history of colonic polyps                        K64.0, First degree hemorrhoids                        K63.5, Polyp of colon                        K62.1, Rectal polyp                        K57.30, Diverticulosis of large intestine without                         perforation or abscess without bleeding CPT copyright 2019 [employer]. All rights reserved. The codes documented in this report are preliminary and upon coder review may  be revised to meet current compliance requirements. Attending Participation:      I personally performed the entire procedure. Volney American, DO [name]  DO, DO 03/16/2022 3:28:20 PM This report has been signed electronically. Number of Addenda: 0 Note Initiated On: 03/16/2022 1:55 PM Scope Withdrawal Time: 0 hours 45 minutes 8 seconds  Total Procedure Duration: 1 hour 2 minutes 51 seconds  Estimated Blood Loss:  Estimated blood loss was minimal.      [facility]
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
## 2022-03-19 ENCOUNTER — Encounter: Payer: Self-pay | Admitting: Gastroenterology

## 2022-03-19 NOTE — Anesthesia Postprocedure Evaluation (Signed)
Anesthesia Post Note  Patient: Kara Hill  Procedure(s) Performed: COLONOSCOPY  Patient location during evaluation: Endoscopy Anesthesia Type: General Level of consciousness: awake and alert Pain management: pain level controlled Vital Signs Assessment: post-procedure vital signs reviewed and stable Respiratory status: spontaneous breathing, nonlabored ventilation, respiratory function stable and patient connected to nasal cannula oxygen Cardiovascular status: blood pressure returned to baseline and stable Postop Assessment: no apparent nausea or vomiting Anesthetic complications: no   No notable events documented.   Last Vitals:  Vitals:   03/16/22 1530 03/16/22 1550  BP: (!) 148/80 (!) 150/88  Pulse:    Resp: 17   Temp:    SpO2: 98% 97%    Last Pain:  Vitals:   03/16/22 1550  TempSrc:   PainSc: 0-No pain                 Dimas Millin

## 2022-03-20 LAB — SURGICAL PATHOLOGY

## 2022-11-13 ENCOUNTER — Other Ambulatory Visit: Payer: Self-pay | Admitting: Internal Medicine

## 2022-11-13 DIAGNOSIS — Z1231 Encounter for screening mammogram for malignant neoplasm of breast: Secondary | ICD-10-CM

## 2022-12-10 ENCOUNTER — Ambulatory Visit
Admission: RE | Admit: 2022-12-10 | Discharge: 2022-12-10 | Disposition: A | Discharge status: Home / Self Care | Payer: Medicare HMO | Source: Ambulatory Visit | Attending: Internal Medicine | Admitting: Internal Medicine

## 2022-12-10 DIAGNOSIS — Z1231 Encounter for screening mammogram for malignant neoplasm of breast: Secondary | ICD-10-CM | POA: Diagnosis present

## 2023-12-06 ENCOUNTER — Encounter: Payer: Self-pay | Admitting: Internal Medicine

## 2023-12-18 ENCOUNTER — Other Ambulatory Visit: Payer: Self-pay | Admitting: Internal Medicine

## 2023-12-18 DIAGNOSIS — Z1231 Encounter for screening mammogram for malignant neoplasm of breast: Secondary | ICD-10-CM

## 2024-01-22 ENCOUNTER — Ambulatory Visit
Admission: RE | Admit: 2024-01-22 | Discharge: 2024-01-22 | Disposition: A | Discharge status: Home / Self Care | Source: Ambulatory Visit | Attending: Internal Medicine | Admitting: Internal Medicine

## 2024-01-22 DIAGNOSIS — Z1231 Encounter for screening mammogram for malignant neoplasm of breast: Secondary | ICD-10-CM | POA: Diagnosis present

## 2024-01-31 IMAGING — DX DG HIP (WITH OR WITHOUT PELVIS) 2-3V*R*
2 series · 2 of 2 positions shown · non-contrast
Comparison: None.

CLINICAL DATA: Status post right hip replacement

EXAM:
DG HIP (WITH OR WITHOUT PELVIS) 2-3V RIGHT

[hip ap]
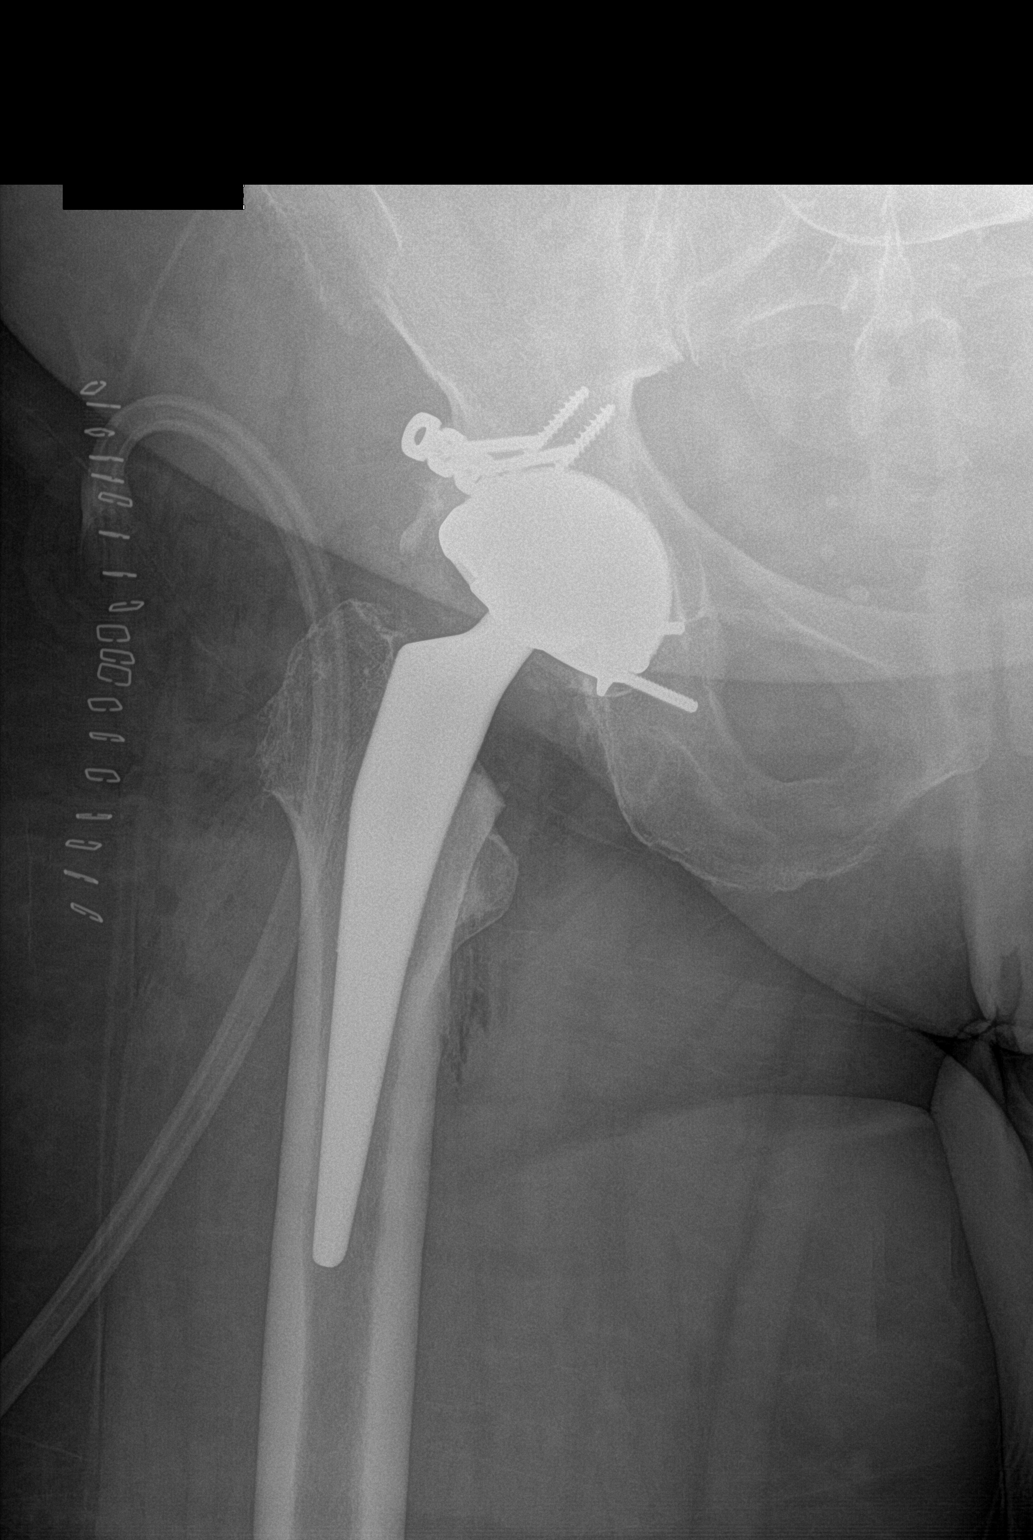

[hip lat]
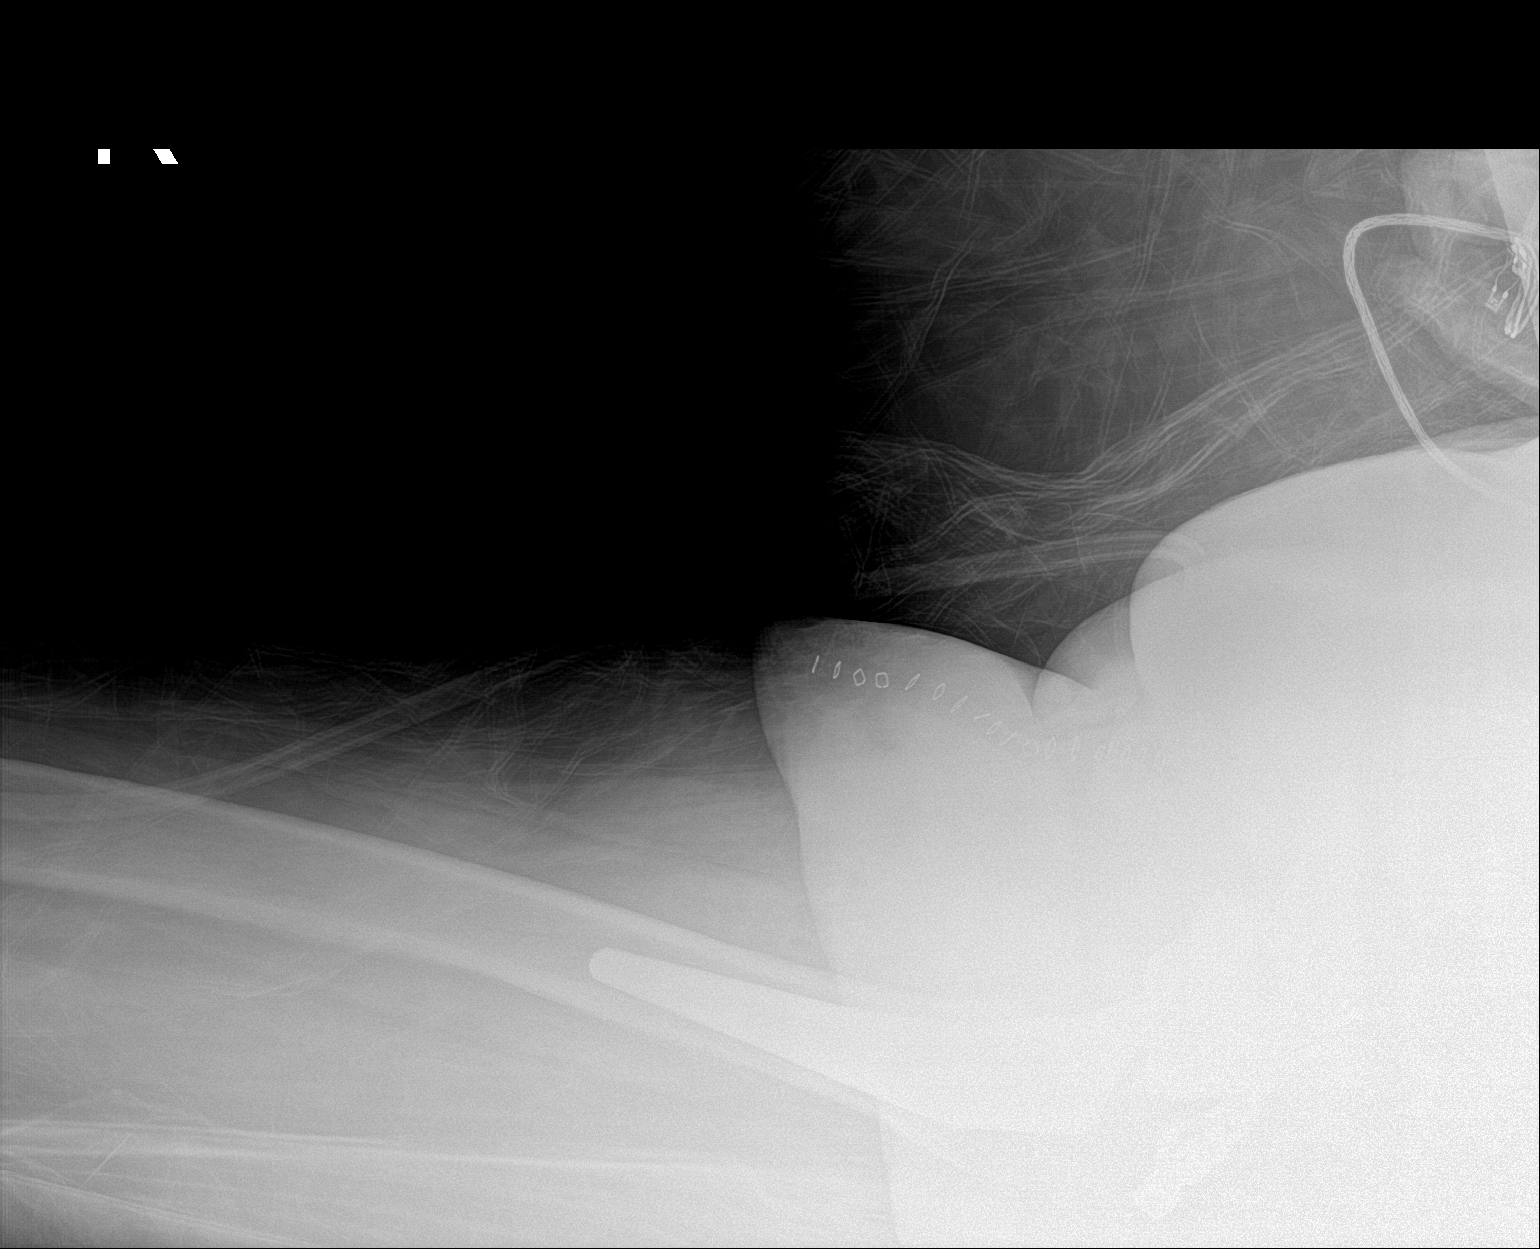

[2 of 2 positions shown; findings below may reference images not displayed]

FINDINGS: Status post right hip total arthroplasty with plate and screw
fixation of the acetabulum and expected overlying postoperative
change. No perihardware fracture or component malpositioning.
IMPRESSION: Status post right hip total arthroplasty. No perihardware fracture
or component malpositioning.
